# Patient Record
Sex: Female | Born: 1965
Health system: Southern US, Community
[De-identification: ages and names within clinical notes are randomized; demographics above are authoritative.]

## PROBLEM LIST (undated history)

## (undated) DIAGNOSIS — T7840XA Allergy, unspecified, initial encounter: Secondary | ICD-10-CM

## (undated) HISTORY — DX: Allergy, unspecified, initial encounter: T78.40XA

---

## 1997-08-04 ENCOUNTER — Other Ambulatory Visit: Admission: RE | Admit: 1997-08-04 | Discharge: 1997-08-04 | Payer: Self-pay | Admitting: Obstetrics and Gynecology

## 1999-07-25 ENCOUNTER — Other Ambulatory Visit: Admission: RE | Admit: 1999-07-25 | Discharge: 1999-07-25 | Payer: Self-pay | Admitting: *Deleted

## 2000-07-26 ENCOUNTER — Other Ambulatory Visit: Admission: RE | Admit: 2000-07-26 | Discharge: 2000-07-26 | Payer: Self-pay | Admitting: *Deleted

## 2001-08-05 ENCOUNTER — Other Ambulatory Visit: Admission: RE | Admit: 2001-08-05 | Discharge: 2001-08-05 | Payer: Self-pay | Admitting: Obstetrics and Gynecology

## 2002-08-07 ENCOUNTER — Other Ambulatory Visit: Admission: RE | Admit: 2002-08-07 | Discharge: 2002-08-07 | Payer: Self-pay | Admitting: Obstetrics and Gynecology

## 2003-03-30 ENCOUNTER — Ambulatory Visit (HOSPITAL_COMMUNITY): Admission: RE | Admit: 2003-03-30 | Discharge: 2003-03-30 | Payer: Self-pay | Admitting: Obstetrics and Gynecology

## 2003-03-30 ENCOUNTER — Encounter: Payer: Self-pay | Admitting: Family Medicine

## 2007-11-06 ENCOUNTER — Encounter: Admission: RE | Admit: 2007-11-06 | Discharge: 2007-11-06 | Payer: Self-pay | Admitting: Obstetrics and Gynecology

## 2007-11-06 ENCOUNTER — Encounter: Payer: Self-pay | Admitting: Family Medicine

## 2007-11-18 ENCOUNTER — Encounter (INDEPENDENT_AMBULATORY_CARE_PROVIDER_SITE_OTHER): Payer: Self-pay | Admitting: *Deleted

## 2007-11-28 ENCOUNTER — Ambulatory Visit: Payer: Self-pay | Admitting: Family Medicine

## 2007-11-28 DIAGNOSIS — E039 Hypothyroidism, unspecified: Secondary | ICD-10-CM | POA: Insufficient documentation

## 2007-11-29 LAB — CONVERTED CEMR LAB
T3, Free: 2.6 pg/mL (ref 2.3–4.2)
T4, Total: 10.4 ug/dL (ref 5.0–12.5)

## 2007-12-01 ENCOUNTER — Encounter (INDEPENDENT_AMBULATORY_CARE_PROVIDER_SITE_OTHER): Payer: Self-pay | Admitting: *Deleted

## 2008-11-12 ENCOUNTER — Encounter: Payer: Self-pay | Admitting: Family Medicine

## 2008-11-15 ENCOUNTER — Encounter: Admission: RE | Admit: 2008-11-15 | Discharge: 2008-11-15 | Payer: Self-pay | Admitting: Family Medicine

## 2008-11-23 ENCOUNTER — Ambulatory Visit: Payer: Self-pay | Admitting: Family Medicine

## 2008-11-23 DIAGNOSIS — IMO0001 Reserved for inherently not codable concepts without codable children: Secondary | ICD-10-CM

## 2008-11-25 ENCOUNTER — Telehealth (INDEPENDENT_AMBULATORY_CARE_PROVIDER_SITE_OTHER): Payer: Self-pay | Admitting: *Deleted

## 2008-11-25 LAB — CONVERTED CEMR LAB: Thyroperoxidase Ab SerPl-aCnc: 33.8 (ref 0.0–60.0)

## 2008-12-07 ENCOUNTER — Encounter (INDEPENDENT_AMBULATORY_CARE_PROVIDER_SITE_OTHER): Payer: Self-pay | Admitting: *Deleted

## 2008-12-07 LAB — CONVERTED CEMR LAB
ALT: 15 units/L (ref 0–35)
AST: 22 units/L (ref 0–37)
Basophils Relative: 0.4 % (ref 0.0–3.0)
Bilirubin, Direct: 0.1 mg/dL (ref 0.0–0.3)
Chloride: 102 meq/L (ref 96–112)
Creatinine, Ser: 0.9 mg/dL (ref 0.4–1.2)
Eosinophils Relative: 1.3 % (ref 0.0–5.0)
GFR calc non Af Amer: 73.3 mL/min (ref >60)
HCT: 37.6 % (ref 36.0–46.0)
LDL Cholesterol: 81 mg/dL (ref 0–99)
MCV: 91.9 fL (ref 78.0–100.0)
Monocytes Absolute: 0.6 10*3/uL (ref 0.1–1.0)
Monocytes Relative: 8.1 % (ref 3.0–12.0)
Neutrophils Relative %: 61 % (ref 43.0–77.0)
Potassium: 4.1 meq/L (ref 3.5–5.1)
RBC: 4.08 M/uL (ref 3.87–5.11)
T3, Free: 3.1 pg/mL (ref 2.3–4.2)
Total Bilirubin: 0.9 mg/dL (ref 0.3–1.2)
Total CHOL/HDL Ratio: 3
Total Protein: 6.8 g/dL (ref 6.0–8.3)
VLDL: 16.8 mg/dL (ref 0.0–40.0)
WBC: 7.5 10*3/uL (ref 4.5–10.5)

## 2009-11-14 ENCOUNTER — Ambulatory Visit: Payer: Self-pay | Admitting: Family Medicine

## 2009-11-14 DIAGNOSIS — J019 Acute sinusitis, unspecified: Secondary | ICD-10-CM

## 2009-11-28 ENCOUNTER — Encounter: Admission: RE | Admit: 2009-11-28 | Discharge: 2009-11-28 | Payer: Self-pay | Admitting: Family Medicine

## 2010-02-12 ENCOUNTER — Encounter: Payer: Self-pay | Admitting: Obstetrics and Gynecology

## 2010-02-21 NOTE — Assessment & Plan Note (Signed)
Summary: congested/cough/cbs   Vital Signs:  Patient profile:   45 year old female Weight:      139 pounds BMI:     20.60 Temp:     98.6 degrees F oral BP sitting:   114 / 76  (left arm)  Vitals Entered By: Doristine Devoid CMA (November 14, 2009 4:26 PM) CC: sinus congestion and drainage x2 wks    History of Present Illness: 45 yo woman here today for sinus congestion.  2.5 weeks ago had URI- nasal congestion, PND.  started Zyrtec D.  continues w/ heavy drainage.  unable to sleep due to gagging feeling.  constantly clearing throat.  + cough due to drainage.  no fevers.  + facial pain/pressure.  no ear pain.  + sick contacts.  also using Mucinex.  Current Medications (verified): 1)  Femcon Fe 0.4-35 Mg-Mcg Chew (Norethin-Eth Estradiol-Fe) .... As Directed 2)  Multivitamins  Caps (Multiple Vitamin) .... Daily 3)  Caltrate 600+d Plus 600-400 Mg-Unit Tabs (Calcium Carbonate-Vit D-Min) .... Daily. 4)  Vitamin D3 2000 Unit Caps (Cholecalciferol) .Marland Kitchen.. 1 By Mouth Daily.  Allergies (verified): No Known Drug Allergies  Review of Systems      See HPI  Physical Exam  General:  Well-developed,well-nourished,in no acute distress; alert,appropriate and cooperative throughout examination.  constantly clearing throat Head:  NCAT, + TTP over frontal and maxillary sinuses Eyes:  no injxn or inflammation Ears:  External ear exam shows no significant lesions or deformities.  Otoscopic examination reveals clear canals, tympanic membranes are intact bilaterally without bulging, retraction, inflammation or discharge. Hearing is grossly normal bilaterally. Nose:  + turbinate edema and congestion Mouth:  + PND Neck:  No deformities, masses, or tenderness noted. Lungs:  Normal respiratory effort, chest expands symmetrically. Lungs are clear to auscultation, no crackles or wheezes. Heart:  Normal rate and regular rhythm. S1 and S2 normal without gallop, murmur, click, rub or other extra  sounds.   Impression & Recommendations:  Problem # 1:  SINUSITIS - ACUTE-NOS (ICD-461.9) Assessment New pt w/ sxs consistent w/ sinus infxn.  start amox.  add nasonex for nasal congestion and PND.  reviewed supportive care and red flags that should prompt return.  Pt expresses understanding and is in agreement w/ this plan. Her updated medication list for this problem includes:    Amoxicillin 500 Mg Tabs (Amoxicillin) .Marland Kitchen... 2 tabs by mouth two times a day x10 days.  take w/ food.  Complete Medication List: 1)  Femcon Fe 0.4-35 Mg-mcg Chew (Norethin-eth estradiol-fe) .... As directed 2)  Multivitamins Caps (Multiple vitamin) .... Daily 3)  Caltrate 600+d Plus 600-400 Mg-unit Tabs (Calcium carbonate-vit d-min) .... Daily. 4)  Vitamin D3 2000 Unit Caps (Cholecalciferol) .Marland Kitchen.. 1 by mouth daily. 5)  Amoxicillin 500 Mg Tabs (Amoxicillin) .... 2 tabs by mouth two times a day x10 days.  take w/ food. 6)  Diflucan 150 Mg Tabs (Fluconazole) .... Once daily.  may repeat in 3 days if sxs persist  Patient Instructions: 1)  Follow up as needed 2)  Use the Nasonex-2 sprays each nostril- daily 3)  Start the Amox for a sinus infection 4)  Continue the mucinex 5)  Drink plenty of fluids 6)  Continue the Zyrtec 7)  Call with any questions or concerns 8)  Hang in there!! Prescriptions: DIFLUCAN 150 MG TABS (FLUCONAZOLE) once daily.  may repeat in 3 days if sxs persist  #2 x 0   Entered and Authorized by:   Neena Rhymes MD   Signed  by:   Neena Rhymes MD on 11/14/2009   Method used:   Electronically to        [facility] [phone]retail[facility]       [address]       [place], [place]  [address]       Ph: [phone]       Fax: [phone]   RxID:   [id number] AMOXICILLIN 500 MG TABS (AMOXICILLIN) 2 tabs by mouth two times a day x10 days.  take w/ food.  #40 x 0   Entered and Authorized by:   Neena Rhymes MD   Signed by:   Neena Rhymes MD on  11/14/2009   Method used:   [facility] to        [facility] [phone][facility] (retail)       [address]       [place], [place]  [address]       Ph: [phone]       Fax: [phone]   RxID:   [id number]    Orders Added: 1)  Est. Patient Level III [30160]

## 2010-07-31 ENCOUNTER — Ambulatory Visit (INDEPENDENT_AMBULATORY_CARE_PROVIDER_SITE_OTHER): Payer: Managed Care, Other (non HMO) | Admitting: Family

## 2010-07-31 ENCOUNTER — Encounter: Payer: Self-pay | Admitting: Family

## 2010-07-31 ENCOUNTER — Telehealth: Payer: Self-pay | Admitting: Family

## 2010-07-31 ENCOUNTER — Ambulatory Visit (HOSPITAL_BASED_OUTPATIENT_CLINIC_OR_DEPARTMENT_OTHER)
Admission: RE | Admit: 2010-07-31 | Discharge: 2010-07-31 | Disposition: A | Discharge status: Home / Self Care | Payer: Worker's Compensation | Source: Ambulatory Visit | Attending: Family | Admitting: Family

## 2010-07-31 VITALS — BP 98/60 | HR 72 | Temp 97.8°F | Resp 16 | Wt 143.1 lb

## 2010-07-31 DIAGNOSIS — X500XXA Overexertion from strenuous movement or load, initial encounter: Secondary | ICD-10-CM

## 2010-07-31 DIAGNOSIS — S92909A Unspecified fracture of unspecified foot, initial encounter for closed fracture: Secondary | ICD-10-CM

## 2010-07-31 DIAGNOSIS — S92901A Unspecified fracture of right foot, initial encounter for closed fracture: Secondary | ICD-10-CM

## 2010-07-31 DIAGNOSIS — Z0189 Encounter for other specified special examinations: Secondary | ICD-10-CM

## 2010-07-31 DIAGNOSIS — M79671 Pain in right foot: Secondary | ICD-10-CM

## 2010-07-31 DIAGNOSIS — X58XXXA Exposure to other specified factors, initial encounter: Secondary | ICD-10-CM | POA: Insufficient documentation

## 2010-07-31 DIAGNOSIS — S92309A Fracture of unspecified metatarsal bone(s), unspecified foot, initial encounter for closed fracture: Secondary | ICD-10-CM | POA: Insufficient documentation

## 2010-07-31 DIAGNOSIS — M79609 Pain in unspecified limb: Secondary | ICD-10-CM | POA: Insufficient documentation

## 2010-07-31 MED ORDER — FLUCONAZOLE 150 MG PO TABS: ORAL_TABLET | ORAL | Status: DC

## 2010-07-31 NOTE — Telephone Encounter (Signed)
Reviewed xray right foot- Acute or subacute oblique right distal fifth metatarsal shaft fracture.  Please contact patient and let her know that her x-ray does show a fracture of the bone leading to her small toe.  I will refer her to orthopedics for further recommendations this week.  She should continue ice and motrin PRN.  Try to stay off of her foot as much as possible until evaluated by orthopedics.

## 2010-07-31 NOTE — Patient Instructions (Signed)
Please complete your x-ray on the first floor.  Call if you yeast infection worsens or if it does not resolve with the diflucan.

## 2010-07-31 NOTE — Progress Notes (Signed)
  Subjective:    Patient ID: Holly Bailey, female    DOB: 02-Jun-1965, 45 y.o.   MRN: [id number]  HPI  [name] is a 45 yr old female who twisted her right foot while pivoting 10-11 days ago.  It was black and blue, swollen.  She was on vacation.  Now notes that her swelling has improved.  Tried Motrin OTC BID and ice which helped with the pain.  Sw was on vacation and was unable to stay off of her foot. Still hurting, can weight bear.  Pain is localized on the right dorsal/lateral foot.     Review of Systems See HPI  No past medical history on file.  History   Social History  . Marital Status: Married    Spouse Name: N/A    Number of Children: N/A  . Years of Education: N/A   Occupational History  . Not on file.   Social History Main Topics  . Smoking status: Never Smoker   . Smokeless tobacco: Never Used  . Alcohol Use: Not on file  . Drug Use: Not on file  . Sexually Active: Not on file   Other Topics Concern  . Not on file   Social History Narrative  . No narrative on file    No past surgical history on file.  No family history on file.  No Known Allergies  No current outpatient prescriptions on file prior to visit.    BP 98/60  Pulse 72  Temp(Src) 97.8 F (36.6 C) (Oral)  Resp 16  Wt 143 lb 1.3 oz (64.901 kg)       Objective:   Physical Exam  Constitutional: She appears well-developed and well-nourished.  HENT:  Head: Normocephalic and atraumatic.  Cardiovascular: Normal rate and regular rhythm.   Pulmonary/Chest: Effort normal and breath sounds normal.  Musculoskeletal:       Positive swelling noted of the right foot. Some tenderness is noted to palpation at the base of the fifth metatarsal. Patient is noted to have bruising on the plantar surface of her foot. She has mild associated swelling.          Assessment & Plan:

## 2010-08-01 DIAGNOSIS — Z309 Encounter for contraceptive management, unspecified: Secondary | ICD-10-CM

## 2010-08-01 NOTE — Telephone Encounter (Signed)
Left message on machine to return my call. 

## 2010-08-01 NOTE — Telephone Encounter (Signed)
Spoke to patient reviewed xray findings re: fracture.  She will see ortho tomorrow AM.

## 2010-08-08 DIAGNOSIS — S92901A Unspecified fracture of right foot, initial encounter for closed fracture: Secondary | ICD-10-CM | POA: Insufficient documentation

## 2010-08-08 NOTE — Assessment & Plan Note (Signed)
X-ray shows oblique right distal fifth metatarsal shaft fracture. The patient was referred to orthopedics for further evaluation and treatment. See phone note.

## 2010-10-27 ENCOUNTER — Other Ambulatory Visit: Payer: Self-pay | Admitting: Family Medicine

## 2010-10-27 DIAGNOSIS — Z1231 Encounter for screening mammogram for malignant neoplasm of breast: Secondary | ICD-10-CM

## 2010-11-14 ENCOUNTER — Other Ambulatory Visit: Payer: Self-pay | Admitting: Sports Medicine

## 2010-11-14 ENCOUNTER — Ambulatory Visit
Admission: RE | Admit: 2010-11-14 | Discharge: 2010-11-14 | Disposition: A | Discharge status: Home / Self Care | Payer: Worker's Compensation | Source: Ambulatory Visit | Attending: Sports Medicine | Admitting: Sports Medicine

## 2010-11-14 DIAGNOSIS — T148XXA Other injury of unspecified body region, initial encounter: Secondary | ICD-10-CM

## 2011-01-12 ENCOUNTER — Ambulatory Visit: Payer: Self-pay

## 2011-02-07 ENCOUNTER — Ambulatory Visit: Payer: Worker's Compensation

## 2011-02-19 ENCOUNTER — Ambulatory Visit
Admission: RE | Admit: 2011-02-19 | Discharge: 2011-02-19 | Disposition: A | Discharge status: Home / Self Care | Payer: Worker's Compensation | Source: Ambulatory Visit | Attending: Family Medicine | Admitting: Family Medicine

## 2011-02-19 DIAGNOSIS — Z1231 Encounter for screening mammogram for malignant neoplasm of breast: Secondary | ICD-10-CM

## 2011-02-22 ENCOUNTER — Other Ambulatory Visit: Payer: Self-pay | Admitting: Family Medicine

## 2011-02-22 DIAGNOSIS — R928 Other abnormal and inconclusive findings on diagnostic imaging of breast: Secondary | ICD-10-CM

## 2011-03-01 ENCOUNTER — Ambulatory Visit
Admission: RE | Admit: 2011-03-01 | Discharge: 2011-03-01 | Disposition: A | Discharge status: Home / Self Care | Payer: BC Managed Care – PPO | Source: Ambulatory Visit | Attending: Family Medicine | Admitting: Family Medicine

## 2011-03-01 DIAGNOSIS — R928 Other abnormal and inconclusive findings on diagnostic imaging of breast: Secondary | ICD-10-CM

## 2012-01-24 ENCOUNTER — Other Ambulatory Visit: Payer: Self-pay | Admitting: Family Medicine

## 2012-01-24 DIAGNOSIS — Z1231 Encounter for screening mammogram for malignant neoplasm of breast: Secondary | ICD-10-CM

## 2012-02-20 ENCOUNTER — Ambulatory Visit: Payer: BC Managed Care – PPO

## 2012-02-20 ENCOUNTER — Ambulatory Visit
Admission: RE | Admit: 2012-02-20 | Discharge: 2012-02-20 | Disposition: A | Discharge status: Home / Self Care | Payer: BC Managed Care – PPO | Source: Ambulatory Visit | Attending: Family Medicine | Admitting: Family Medicine

## 2012-02-20 DIAGNOSIS — Z1231 Encounter for screening mammogram for malignant neoplasm of breast: Secondary | ICD-10-CM

## 2012-04-16 ENCOUNTER — Ambulatory Visit (INDEPENDENT_AMBULATORY_CARE_PROVIDER_SITE_OTHER): Payer: BC Managed Care – PPO | Admitting: Family Medicine

## 2012-04-16 VITALS — BP 92/62 | HR 76 | Temp 98.9°F | Wt 153.0 lb

## 2012-04-16 DIAGNOSIS — R21 Rash and other nonspecific skin eruption: Secondary | ICD-10-CM

## 2012-04-16 MED ORDER — PREDNISONE 10 MG PO TABS: ORAL_TABLET | ORAL | Status: DC

## 2012-04-16 MED ORDER — METHYLPREDNISOLONE ACETATE 80 MG/ML IJ SUSP
80.0000 mg | Freq: Once | INTRAMUSCULAR | Status: AC
  Administered 2012-04-16: 80 mg via INTRAMUSCULAR

## 2012-04-16 NOTE — Patient Instructions (Signed)

## 2012-04-17 ENCOUNTER — Encounter: Payer: Self-pay | Admitting: Family Medicine

## 2012-04-17 NOTE — Progress Notes (Signed)
  Subjective:     Holly Bailey is a 47 y.o. female who presents for evaluation of a rash involving the trunk. Rash started 3 days ago. Lesions are pink, and raised in texture. Rash has changed over time. Rash is pruritic. Associated symptoms: none. Patient denies: abdominal pain, arthralgia, congestion, cough, crankiness, decrease in appetite, decrease in energy level, fever, headache, irritability, myalgia, nausea, sore throat and vomiting. Patient has not had contacts with similar rash. Patient has had new exposures (soaps, lotions, laundry detergents, foods, medications, plants, insects or animals).---tuna  The following portions of the patient's history were reviewed and updated as appropriate: allergies, current medications, past family history, past medical history, past social history, past surgical history and problem list.  Review of Systems Pertinent items are noted in HPI.    Objective:    BP 92/62  Pulse 76  Temp(Src) 98.9 F (37.2 C) (Oral)  Wt 153 lb (69.4 kg)  BMI 22.58 kg/m2  SpO2 98% General:  alert, cooperative, appears stated age and no distress  Skin:  papules noted on extremities and trunk     Assessment:    rash-- allergic reaction to tuna    Plan:    Medications: benadryl and steroids: pred taper. verbal and written patient instruction given. Follow up in several days. --if no better

## 2012-11-28 ENCOUNTER — Other Ambulatory Visit: Payer: Self-pay

## 2012-11-28 DIAGNOSIS — Z1231 Encounter for screening mammogram for malignant neoplasm of breast: Secondary | ICD-10-CM

## 2013-01-28 ENCOUNTER — Telehealth: Payer: Self-pay | Admitting: *Deleted

## 2013-01-28 ENCOUNTER — Ambulatory Visit (INDEPENDENT_AMBULATORY_CARE_PROVIDER_SITE_OTHER): Payer: BC Managed Care – PPO | Admitting: Family Medicine

## 2013-01-28 ENCOUNTER — Encounter: Payer: Self-pay | Admitting: Family Medicine

## 2013-01-28 VITALS — BP 100/68 | HR 80 | Temp 98.3°F | Wt 156.0 lb

## 2013-01-28 DIAGNOSIS — R350 Frequency of micturition: Secondary | ICD-10-CM

## 2013-01-28 LAB — POCT URINALYSIS DIPSTICK
Bilirubin, UA: NEGATIVE
Glucose, UA: NEGATIVE
Ketones, UA: NEGATIVE
NITRITE UA: NEGATIVE
PH UA: 6
Spec Grav, UA: 1.015
UROBILINOGEN UA: 0.2

## 2013-01-28 MED ORDER — FLUCONAZOLE 150 MG PO TABS: 150.0000 mg | ORAL_TABLET | Freq: Once | ORAL | Status: DC

## 2013-01-28 MED ORDER — LEVOFLOXACIN 250 MG PO TABS: 250.0000 mg | ORAL_TABLET | Freq: Every day | ORAL | Status: DC

## 2013-01-28 NOTE — Progress Notes (Signed)
  Subjective:    Holly Bailey is a 48 y.o. female who complains of burning with urination and frequency. She has had symptoms for 3 days..  No other complaints.  Patient denies back pain, congestion, cough, fever, headache, rhinitis, sorethroat, stomach ache and vaginal discharge. Patient does have a history of recurrent UTI. Patient does not have a history of pyelonephritis.   The following portions of the patient's history were reviewed and updated as appropriate: allergies, current medications, past family history, past medical history, past social history, past surgical history and problem list.  Review of Systems Pertinent items are noted in HPI.    Objective:    BP 100/68  Pulse 80  Temp(Src) 98.3 F (36.8 C) (Oral)  Wt 156 lb (70.761 kg)  SpO2 99% General appearance: alert, cooperative, appears stated age and no distress  Laboratory:  Urine dipstick: mod for hemoglobin and 3+ for leukocyte esterase.   Micro exam: not done.    Assessment:    Acute cystitis and UTI     Plan:  Check culture  Medications: levaquin per pt request--- "its the only thing that work". Maintain adequate hydration. Follow up if symptoms not improving, and as needed.

## 2013-01-28 NOTE — Progress Notes (Signed)
Pre visit review using our clinic review tool, if applicable. No additional management support is needed unless otherwise documented below in the visit note. 

## 2013-01-28 NOTE — Telephone Encounter (Signed)
Rx sent      KP 

## 2013-01-28 NOTE — Telephone Encounter (Signed)
Patient called and stated she for got to ask dr Laury Axonlowne to send in Diflucan for her too as well because of the levofloxacin (LEVAQUIN) 250 MG tablet cause her to have a yeast infection.   [facility] [place]

## 2013-01-28 NOTE — Patient Instructions (Signed)
Urinary Tract Infection  Urinary tract infections (UTIs) can develop anywhere along your urinary tract. Your urinary tract is your body's drainage system for removing wastes and extra water. Your urinary tract includes two kidneys, two ureters, a bladder, and a urethra. Your kidneys are a pair of bean-shaped organs. Each kidney is about the size of your fist. They are located below your ribs, one on each side of your spine.  CAUSES  Infections are caused by microbes, which are microscopic organisms, including fungi, viruses, and bacteria. These organisms are so small that they can only be seen through a microscope. Bacteria are the microbes that most commonly cause UTIs.  SYMPTOMS   Symptoms of UTIs may vary by age and gender of the patient and by the location of the infection. Symptoms in young women typically include a frequent and intense urge to urinate and a painful, burning feeling in the bladder or urethra during urination. Older women and men are more likely to be tired, shaky, and weak and have muscle aches and abdominal pain. A fever may mean the infection is in your kidneys. Other symptoms of a kidney infection include pain in your back or sides below the ribs, nausea, and vomiting.  DIAGNOSIS  To diagnose a UTI, your caregiver will ask you about your symptoms. Your caregiver also will ask to provide a urine sample. The urine sample will be tested for bacteria and white blood cells. White blood cells are made by your body to help fight infection.  TREATMENT   Typically, UTIs can be treated with medication. Because most UTIs are caused by a bacterial infection, they usually can be treated with the use of antibiotics. The choice of antibiotic and length of treatment depend on your symptoms and the type of bacteria causing your infection.  HOME CARE INSTRUCTIONS   If you were prescribed antibiotics, take them exactly as your caregiver instructs you. Finish the medication even if you feel better after you  have only taken some of the medication.   Drink enough water and fluids to keep your urine clear or pale yellow.   Avoid caffeine, tea, and carbonated beverages. They tend to irritate your bladder.   Empty your bladder often. Avoid holding urine for long periods of time.   Empty your bladder before and after sexual intercourse.   After a bowel movement, women should cleanse from front to back. Use each tissue only once.  SEEK MEDICAL CARE IF:    You have back pain.   You develop a fever.   Your symptoms do not begin to resolve within 3 days.  SEEK IMMEDIATE MEDICAL CARE IF:    You have severe back pain or lower abdominal pain.   You develop chills.   You have nausea or vomiting.   You have continued burning or discomfort with urination.  MAKE SURE YOU:    Understand these instructions.   Will watch your condition.   Will get help right away if you are not doing well or get worse.  Document Released: 10/18/2004 Document Revised: 07/10/2011 Document Reviewed: 02/16/2011  [employer] Patient Information 2014 [employer].

## 2013-01-30 ENCOUNTER — Telehealth: Payer: Self-pay | Admitting: Family Medicine

## 2013-01-30 LAB — URINE CULTURE: Colony Count: >=100000

## 2013-01-30 MED ORDER — LEVOFLOXACIN 500 MG PO TABS: 500.0000 mg | ORAL_TABLET | Freq: Every day | ORAL | Status: DC

## 2013-01-30 NOTE — Telephone Encounter (Signed)
Discussed with patient. Rx. Sent.

## 2013-01-30 NOTE — Telephone Encounter (Signed)
levaquin 500 mg 1 po qd for 5 days

## 2013-01-30 NOTE — Telephone Encounter (Signed)
Patient is calling to request a higher dose of levofloxacin (LEVAQUIN) 250 MG rx. States that she is on day 3 of 5 and her symptoms have not improved. Please advise. [facility] [place]

## 2013-01-30 NOTE — Telephone Encounter (Signed)
Please advise      KP 

## 2013-03-06 ENCOUNTER — Ambulatory Visit
Admission: RE | Admit: 2013-03-06 | Discharge: 2013-03-06 | Disposition: A | Discharge status: Home / Self Care | Payer: BC Managed Care – PPO | Source: Ambulatory Visit

## 2013-03-06 ENCOUNTER — Ambulatory Visit: Payer: BC Managed Care – PPO

## 2013-03-06 ENCOUNTER — Other Ambulatory Visit: Payer: Self-pay

## 2013-03-06 DIAGNOSIS — Z1231 Encounter for screening mammogram for malignant neoplasm of breast: Secondary | ICD-10-CM

## 2013-04-11 ENCOUNTER — Encounter: Payer: Self-pay | Admitting: Family Medicine

## 2013-04-11 ENCOUNTER — Ambulatory Visit (INDEPENDENT_AMBULATORY_CARE_PROVIDER_SITE_OTHER): Payer: BC Managed Care – PPO | Admitting: Family Medicine

## 2013-04-11 VITALS — BP 98/72 | HR 84 | Temp 98.2°F | Wt 156.0 lb

## 2013-04-11 DIAGNOSIS — L739 Follicular disorder, unspecified: Secondary | ICD-10-CM

## 2013-04-11 DIAGNOSIS — L729 Follicular cyst of the skin and subcutaneous tissue, unspecified: Principal | ICD-10-CM

## 2013-04-11 DIAGNOSIS — L678 Other hair color and hair shaft abnormalities: Secondary | ICD-10-CM

## 2013-04-11 DIAGNOSIS — L738 Other specified follicular disorders: Secondary | ICD-10-CM

## 2013-04-11 MED ORDER — BACITRACIN 500 UNIT/GM EX OINT: 1.0000 "application " | TOPICAL_OINTMENT | Freq: Two times a day (BID) | CUTANEOUS | Status: DC

## 2013-04-11 NOTE — Patient Instructions (Signed)
Nice to meet you Ocean spray or nasal saline only.  Try the samples we gave you to see if they help  Bacitracin twice daily.  Come back in 2 week sif still not better.

## 2013-04-11 NOTE — Assessment & Plan Note (Signed)
Patient has what appears to be more of a folliculitis of the nose. Patient is concern for a MRSA infection in full sentences likely not related. Patient was given some samples of triple antibiotic toward the potential allergy reaction. Patient also given a prescription for bacitracin which I think will be beneficial. We discussed salines that could be helpful. Patient should avoid corticosteroid. This continues to give patient problems with the amount of closure I noted of her sinuses consider a referral to ENT in the future. She will discuss this with primary care provider. If this does not help patient may need oral antibiotics for the folliculitis and she knows to seek medical attention if this does not clear up in the next week

## 2013-04-11 NOTE — Progress Notes (Signed)
  Holly ScaleZach Severiano Bailey D.O. [facility] [address] [place], [place] [address] Phone: [phone] Subjective:     CC: bumps inside nose  OZH:[id number]:Subjective Holly DoppStacey L Bailey is a 48 y.o. female coming in with complaint of bumps inside her nose. Patient has had this periodically multiple times. Patient has actually usually be able to put pressure on these bumps and they seem to pop causing some pus within it seems to resolve on its own. Patient states his last time has been lasting for approximately 2 weeks. Patient went to an urgent care recently who told her to continue with irrigation of the nose did not give her any type of medications. Patient actually was given Flonase by another provider and has tried doing this and seems unfortunately make it worse. Patient denies any fevers or chills or any bleeding. Patient has had chronic difficulties with her sinuses for multiple years.     Past medical history, social, surgical and family history all reviewed in electronic medical record.   Review of Systems: No headache, visual changes, nausea, vomiting, diarrhea, constipation, dizziness, abdominal pain, skin rash, fevers, chills, night sweats, weight loss, swollen lymph nodes, body aches, joint swelling, muscle aches, chest pain, shortness of breath, mood changes.   Objective Blood pressure 98/72, pulse 84, temperature 98.2 F (36.8 C), temperature source Oral, weight 156 lb (70.761 kg), SpO2 98.00%.  General: No apparent distress alert and oriented x3 mood and affect normal, dressed appropriately.  HEENT: Pupils equal, extraocular movements intact patient snares show that patient does have what appears to be more of this this folliculitis in the bilateral nares mucosa. Denies any severe inflammation but there is erythema of the mucosa. Patient does have a deviated septum to the right causing closure of the left tears proximally two thirds the way above the middle turbinate. Respiratory: Patient's  speak in full sentences and does not appear short of breath  Cardiovascular: No lower extremity edema, non tender, no erythema  Skin: Warm dry intact with no signs of infection or rash on extremities or on axial skeleton.  Abdomen: Soft nontender  Neuro: Cranial nerves II through XII are intact, neurovascularly intact in all extremities with 2+ DTRs and 2+ pulses.  Lymph: No lymphadenopathy of posterior or anterior cervical chain or axillae bilaterally.  Gait normal with good balance and coordination.  MSK:  Non tender with full range of motion and good stability and symmetric strength and tone of shoulders, elbows, wrist, hip, knee and ankles bilaterally.      Impression and Recommendations:     This case required medical decision making of moderate complexity.

## 2014-01-12 ENCOUNTER — Other Ambulatory Visit: Payer: Self-pay

## 2014-01-12 DIAGNOSIS — Z1231 Encounter for screening mammogram for malignant neoplasm of breast: Secondary | ICD-10-CM

## 2014-03-12 ENCOUNTER — Ambulatory Visit: Payer: BC Managed Care – PPO

## 2014-03-12 ENCOUNTER — Ambulatory Visit
Admission: RE | Admit: 2014-03-12 | Discharge: 2014-03-12 | Disposition: A | Discharge status: Home / Self Care | Payer: BLUE CROSS/BLUE SHIELD | Source: Ambulatory Visit

## 2014-03-12 DIAGNOSIS — Z1231 Encounter for screening mammogram for malignant neoplasm of breast: Secondary | ICD-10-CM

## 2015-03-15 ENCOUNTER — Other Ambulatory Visit: Payer: Self-pay

## 2015-03-15 DIAGNOSIS — Z1231 Encounter for screening mammogram for malignant neoplasm of breast: Secondary | ICD-10-CM

## 2015-04-01 ENCOUNTER — Ambulatory Visit
Admission: RE | Admit: 2015-04-01 | Discharge: 2015-04-01 | Disposition: A | Discharge status: Home / Self Care | Payer: BLUE CROSS/BLUE SHIELD | Source: Ambulatory Visit

## 2015-04-01 DIAGNOSIS — Z1231 Encounter for screening mammogram for malignant neoplasm of breast: Secondary | ICD-10-CM

## 2015-05-02 ENCOUNTER — Telehealth: Payer: Self-pay | Admitting: *Deleted

## 2015-05-02 NOTE — Telephone Encounter (Signed)
Unable to reach patient at time of pre-visit call. Left message for patient to return call when available.  

## 2015-05-03 ENCOUNTER — Encounter: Payer: Self-pay | Admitting: Family Medicine

## 2015-05-03 ENCOUNTER — Ambulatory Visit (INDEPENDENT_AMBULATORY_CARE_PROVIDER_SITE_OTHER): Payer: BLUE CROSS/BLUE SHIELD | Admitting: Family Medicine

## 2015-05-03 VITALS — BP 110/64 | HR 86 | Temp 98.2°F | Ht 68.5 in | Wt 159.0 lb

## 2015-05-03 DIAGNOSIS — N912 Amenorrhea, unspecified: Secondary | ICD-10-CM | POA: Diagnosis not present

## 2015-05-03 DIAGNOSIS — R5382 Chronic fatigue, unspecified: Secondary | ICD-10-CM

## 2015-05-03 DIAGNOSIS — Z23 Encounter for immunization: Secondary | ICD-10-CM | POA: Diagnosis not present

## 2015-05-03 DIAGNOSIS — Z0000 Encounter for general adult medical examination without abnormal findings: Secondary | ICD-10-CM

## 2015-05-03 LAB — COMPREHENSIVE METABOLIC PANEL
ALBUMIN: 4.1 g/dL (ref 3.5–5.2)
ALK PHOS: 44 U/L (ref 39–117)
ALT: 13 U/L (ref 0–35)
AST: 18 U/L (ref 0–37)
BILIRUBIN TOTAL: 0.8 mg/dL (ref 0.2–1.2)
BUN: 12 mg/dL (ref 6–23)
CO2: 26 mEq/L (ref 19–32)
CREATININE: 0.86 mg/dL (ref 0.40–1.20)
Calcium: 9.1 mg/dL (ref 8.4–10.5)
Chloride: 104 mEq/L (ref 96–112)
GFR: 75 mL/min (ref >60.00)
GLUCOSE: 88 mg/dL (ref 70–99)
POTASSIUM: 3.7 meq/L (ref 3.5–5.1)
SODIUM: 138 meq/L (ref 135–145)
Total Protein: 7.2 g/dL (ref 6.0–8.3)

## 2015-05-03 LAB — CBC
HCT: 39.9 % (ref 36.0–46.0)
HEMOGLOBIN: 13.6 g/dL (ref 12.0–15.0)
MCHC: 34.2 g/dL (ref 30.0–36.0)
MCV: 86.4 fl (ref 78.0–100.0)
Platelets: 299 10*3/uL (ref 150.0–400.0)
RBC: 4.62 Mil/uL (ref 3.87–5.11)
RDW: 12.9 % (ref 11.5–15.5)
WBC: 6.8 10*3/uL (ref 4.0–10.5)

## 2015-05-03 LAB — LUTEINIZING HORMONE: LH: 0.4 m[IU]/mL

## 2015-05-03 LAB — LIPID PANEL
Cholesterol: 169 mg/dL (ref 0–200)
HDL: 58.5 mg/dL (ref >39.00)
LDL Cholesterol: 98 mg/dL (ref 0–99)
NONHDL: 110.76
Total CHOL/HDL Ratio: 3
Triglycerides: 66 mg/dL (ref 0.0–149.0)
VLDL: 13.2 mg/dL (ref 0.0–40.0)

## 2015-05-03 LAB — TSH: TSH: 2.41 u[IU]/mL (ref 0.35–4.50)

## 2015-05-03 LAB — T4, FREE: Free T4: 0.89 ng/dL (ref 0.60–1.60)

## 2015-05-03 LAB — FOLLICLE STIMULATING HORMONE: FSH: 2.1 m[IU]/mL

## 2015-05-03 LAB — T3: T3 TOTAL: 145 ng/dL (ref 76–181)

## 2015-05-03 NOTE — Patient Instructions (Signed)
Preventive Care for Adults, Female A healthy lifestyle and preventive care can promote health and wellness. Preventive health guidelines for women include the following key practices.  A routine yearly physical is a good way to check with your health care provider about your health and preventive screening. It is a chance to share any concerns and updates on your health and to receive a thorough exam.  Visit your dentist for a routine exam and preventive care every 6 months. Brush your teeth twice a day and floss once a day. Good oral hygiene prevents tooth decay and gum disease.  The frequency of eye exams is based on your age, health, family medical history, use of contact lenses, and other factors. Follow your health care provider's recommendations for frequency of eye exams.  Eat a healthy diet. Foods like vegetables, fruits, whole grains, low-fat dairy products, and lean protein foods contain the nutrients you need without too many calories. Decrease your intake of foods high in solid fats, added sugars, and salt. Eat the right amount of calories for you.Get information about a proper diet from your health care provider, if necessary.  Regular physical exercise is one of the most important things you can do for your health. Most adults should get at least 150 minutes of moderate-intensity exercise (any activity that increases your heart rate and causes you to sweat) each week. In addition, most adults need muscle-strengthening exercises on 2 or more days a week.  Maintain a healthy weight. The body mass index (BMI) is a screening tool to identify possible weight problems. It provides an estimate of body fat based on height and weight. Your health care provider can find your BMI and can help you achieve or maintain a healthy weight.For adults 20 years and older:  A BMI below 18.5 is considered underweight.  A BMI of 18.5 to 24.9 is normal.  A BMI of 25 to 29.9 is considered overweight.  A  BMI of 30 and above is considered obese.  Maintain normal blood lipids and cholesterol levels by exercising and minimizing your intake of saturated fat. Eat a balanced diet with plenty of fruit and vegetables. Blood tests for lipids and cholesterol should begin at age 45 and be repeated every 5 years. If your lipid or cholesterol levels are high, you are over 50, or you are at high risk for heart disease, you may need your cholesterol levels checked more frequently.Ongoing high lipid and cholesterol levels should be treated with medicines if diet and exercise are not working.  If you smoke, find out from your health care provider how to quit. If you do not use tobacco, do not start.  Lung cancer screening is recommended for adults aged 45-80 years who are at high risk for developing lung cancer because of a history of smoking. A yearly low-dose CT scan of the lungs is recommended for people who have at least a 30-pack-year history of smoking and are a current smoker or have quit within the past 15 years. A pack year of smoking is smoking an average of 1 pack of cigarettes a day for 1 year (for example: 1 pack a day for 30 years or 2 packs a day for 15 years). Yearly screening should continue until the smoker has stopped smoking for at least 15 years. Yearly screening should be stopped for people who develop a health problem that would prevent them from having lung cancer treatment.  If you are pregnant, do not drink alcohol. If you are  breastfeeding, be very cautious about drinking alcohol. If you are not pregnant and choose to drink alcohol, do not have more than 1 drink per day. One drink is considered to be 12 ounces (355 mL) of beer, 5 ounces (148 mL) of wine, or 1.5 ounces (44 mL) of liquor.  Avoid use of street drugs. Do not share needles with anyone. Ask for help if you need support or instructions about stopping the use of drugs.  High blood pressure causes heart disease and increases the risk  of stroke. Your blood pressure should be checked at least every 1 to 2 years. Ongoing high blood pressure should be treated with medicines if weight loss and exercise do not work.  If you are 55-79 years old, ask your health care provider if you should take aspirin to prevent strokes.  Diabetes screening is done by taking a blood sample to check your blood glucose level after you have not eaten for a certain period of time (fasting). If you are not overweight and you do not have risk factors for diabetes, you should be screened once every 3 years starting at age 45. If you are overweight or obese and you are 40-70 years of age, you should be screened for diabetes every year as part of your cardiovascular risk assessment.  Breast cancer screening is essential preventive care for women. You should practice "breast self-awareness." This means understanding the normal appearance and feel of your breasts and may include breast self-examination. Any changes detected, no matter how small, should be reported to a health care provider. Women in their 20s and 30s should have a clinical breast exam (CBE) by a health care provider as part of a regular health exam every 1 to 3 years. After age 40, women should have a CBE every year. Starting at age 40, women should consider having a mammogram (breast X-ray test) every year. Women who have a family history of breast cancer should talk to their health care provider about genetic screening. Women at a high risk of breast cancer should talk to their health care providers about having an MRI and a mammogram every year.  Breast cancer gene (BRCA)-related cancer risk assessment is recommended for women who have family members with BRCA-related cancers. BRCA-related cancers include breast, ovarian, tubal, and peritoneal cancers. Having family members with these cancers may be associated with an increased risk for harmful changes (mutations) in the breast cancer genes BRCA1 and  BRCA2. Results of the assessment will determine the need for genetic counseling and BRCA1 and BRCA2 testing.  Your health care provider may recommend that you be screened regularly for cancer of the pelvic organs (ovaries, uterus, and vagina). This screening involves a pelvic examination, including checking for microscopic changes to the surface of your cervix (Pap test). You may be encouraged to have this screening done every 3 years, beginning at age 21.  For women ages 30-65, health care providers may recommend pelvic exams and Pap testing every 3 years, or they may recommend the Pap and pelvic exam, combined with testing for human papilloma virus (HPV), every 5 years. Some types of HPV increase your risk of cervical cancer. Testing for HPV may also be done on women of any age with unclear Pap test results.  Other health care providers may not recommend any screening for nonpregnant women who are considered low risk for pelvic cancer and who do not have symptoms. Ask your health care provider if a screening pelvic exam is right for   you.  If you have had past treatment for cervical cancer or a condition that could lead to cancer, you need Pap tests and screening for cancer for at least 20 years after your treatment. If Pap tests have been discontinued, your risk factors (such as having a new sexual partner) need to be reassessed to determine if screening should resume. Some women have medical problems that increase the chance of getting cervical cancer. In these cases, your health care provider may recommend more frequent screening and Pap tests.  Colorectal cancer can be detected and often prevented. Most routine colorectal cancer screening begins at the age of 50 years and continues through age 75 years. However, your health care provider may recommend screening at an earlier age if you have risk factors for colon cancer. On a yearly basis, your health care provider may provide home test kits to check  for hidden blood in the stool. Use of a small camera at the end of a tube, to directly examine the colon (sigmoidoscopy or colonoscopy), can detect the earliest forms of colorectal cancer. Talk to your health care provider about this at age 50, when routine screening begins. Direct exam of the colon should be repeated every 5-10 years through age 75 years, unless early forms of precancerous polyps or small growths are found.  People who are at an increased risk for hepatitis B should be screened for this virus. You are considered at high risk for hepatitis B if:  You were born in a country where hepatitis B occurs often. Talk with your health care provider about which countries are considered high risk.  Your parents were born in a high-risk country and you have not received a shot to protect against hepatitis B (hepatitis B vaccine).  You have HIV or AIDS.  You use needles to inject street drugs.  You live with, or have sex with, someone who has hepatitis B.  You get hemodialysis treatment.  You take certain medicines for conditions like cancer, organ transplantation, and autoimmune conditions.  Hepatitis C blood testing is recommended for all people born from 1945 through 1965 and any individual with known risks for hepatitis C.  Practice safe sex. Use condoms and avoid high-risk sexual practices to reduce the spread of sexually transmitted infections (STIs). STIs include gonorrhea, chlamydia, syphilis, trichomonas, herpes, HPV, and human immunodeficiency virus (HIV). Herpes, HIV, and HPV are viral illnesses that have no cure. They can result in disability, cancer, and death.  You should be screened for sexually transmitted illnesses (STIs) including gonorrhea and chlamydia if:  You are sexually active and are younger than 24 years.  You are older than 24 years and your health care provider tells you that you are at risk for this type of infection.  Your sexual activity has changed  since you were last screened and you are at an increased risk for chlamydia or gonorrhea. Ask your health care provider if you are at risk.  If you are at risk of being infected with HIV, it is recommended that you take a prescription medicine daily to prevent HIV infection. This is called preexposure prophylaxis (PrEP). You are considered at risk if:  You are sexually active and do not regularly use condoms or know the HIV status of your partner(s).  You take drugs by injection.  You are sexually active with a partner who has HIV.  Talk with your health care provider about whether you are at high risk of being infected with HIV. If   you choose to begin PrEP, you should first be tested for HIV. You should then be tested every 3 months for as long as you are taking PrEP.  Osteoporosis is a disease in which the bones lose minerals and strength with aging. This can result in serious bone fractures or breaks. The risk of osteoporosis can be identified using a bone density scan. Women ages 67 years and over and women at risk for fractures or osteoporosis should discuss screening with their health care providers. Ask your health care provider whether you should take a calcium supplement or vitamin D to reduce the rate of osteoporosis.  Menopause can be associated with physical symptoms and risks. Hormone replacement therapy is available to decrease symptoms and risks. You should talk to your health care provider about whether hormone replacement therapy is right for you.  Use sunscreen. Apply sunscreen liberally and repeatedly throughout the day. You should seek shade when your shadow is shorter than you. Protect yourself by wearing long sleeves, pants, a wide-brimmed hat, and sunglasses year round, whenever you are outdoors.  Once a month, do a whole body skin exam, using a mirror to look at the skin on your back. Tell your health care provider of new moles, moles that have irregular borders, moles that  are larger than a pencil eraser, or moles that have changed in shape or color.  Stay current with required vaccines (immunizations).  Influenza vaccine. All adults should be immunized every year.  Tetanus, diphtheria, and acellular pertussis (Td, Tdap) vaccine. Pregnant women should receive 1 dose of Tdap vaccine during each pregnancy. The dose should be obtained regardless of the length of time since the last dose. Immunization is preferred during the 27th-36th week of gestation. An adult who has not previously received Tdap or who does not know her vaccine status should receive 1 dose of Tdap. This initial dose should be followed by tetanus and diphtheria toxoids (Td) booster doses every 10 years. Adults with an unknown or incomplete history of completing a 3-dose immunization series with Td-containing vaccines should begin or complete a primary immunization series including a Tdap dose. Adults should receive a Td booster every 10 years.  Varicella vaccine. An adult without evidence of immunity to varicella should receive 2 doses or a second dose if she has previously received 1 dose. Pregnant females who do not have evidence of immunity should receive the first dose after pregnancy. This first dose should be obtained before leaving the health care facility. The second dose should be obtained 4-8 weeks after the first dose.  Human papillomavirus (HPV) vaccine. Females aged 13-26 years who have not received the vaccine previously should obtain the 3-dose series. The vaccine is not recommended for use in pregnant females. However, pregnancy testing is not needed before receiving a dose. If a female is found to be pregnant after receiving a dose, no treatment is needed. In that case, the remaining doses should be delayed until after the pregnancy. Immunization is recommended for any person with an immunocompromised condition through the age of 61 years if she did not get any or all doses earlier. During the  3-dose series, the second dose should be obtained 4-8 weeks after the first dose. The third dose should be obtained 24 weeks after the first dose and 16 weeks after the second dose.  Zoster vaccine. One dose is recommended for adults aged 30 years or older unless certain conditions are present.  Measles, mumps, and rubella (MMR) vaccine. Adults born  before 1957 generally are considered immune to measles and mumps. Adults born in 1957 or later should have 1 or more doses of MMR vaccine unless there is a contraindication to the vaccine or there is laboratory evidence of immunity to each of the three diseases. A routine second dose of MMR vaccine should be obtained at least 28 days after the first dose for students attending postsecondary schools, health care workers, or international travelers. People who received inactivated measles vaccine or an unknown type of measles vaccine during 1963-1967 should receive 2 doses of MMR vaccine. People who received inactivated mumps vaccine or an unknown type of mumps vaccine before 1979 and are at high risk for mumps infection should consider immunization with 2 doses of MMR vaccine. For females of childbearing age, rubella immunity should be determined. If there is no evidence of immunity, females who are not pregnant should be vaccinated. If there is no evidence of immunity, females who are pregnant should delay immunization until after pregnancy. Unvaccinated health care workers born before 1957 who lack laboratory evidence of measles, mumps, or rubella immunity or laboratory confirmation of disease should consider measles and mumps immunization with 2 doses of MMR vaccine or rubella immunization with 1 dose of MMR vaccine.  Pneumococcal 13-valent conjugate (PCV13) vaccine. When indicated, a person who is uncertain of his immunization history and has no record of immunization should receive the PCV13 vaccine. All adults 65 years of age and older should receive this  vaccine. An adult aged 19 years or older who has certain medical conditions and has not been previously immunized should receive 1 dose of PCV13 vaccine. This PCV13 should be followed with a dose of pneumococcal polysaccharide (PPSV23) vaccine. Adults who are at high risk for pneumococcal disease should obtain the PPSV23 vaccine at least 8 weeks after the dose of PCV13 vaccine. Adults older than 48 years of age who have normal immune system function should obtain the PPSV23 vaccine dose at least 1 year after the dose of PCV13 vaccine.  Pneumococcal polysaccharide (PPSV23) vaccine. When PCV13 is also indicated, PCV13 should be obtained first. All adults aged 65 years and older should be immunized. An adult younger than age 65 years who has certain medical conditions should be immunized. Any person who resides in a nursing home or long-term care facility should be immunized. An adult smoker should be immunized. People with an immunocompromised condition and certain other conditions should receive both PCV13 and PPSV23 vaccines. People with human immunodeficiency virus (HIV) infection should be immunized as soon as possible after diagnosis. Immunization during chemotherapy or radiation therapy should be avoided. Routine use of PPSV23 vaccine is not recommended for American Indians, [place] Natives, or people younger than 65 years unless there are medical conditions that require PPSV23 vaccine. When indicated, people who have unknown immunization and have no record of immunization should receive PPSV23 vaccine. One-time revaccination 5 years after the first dose of PPSV23 is recommended for people aged 19-64 years who have chronic kidney failure, nephrotic syndrome, asplenia, or immunocompromised conditions. People who received 1-2 doses of PPSV23 before age 65 years should receive another dose of PPSV23 vaccine at age 65 years or later if at least 5 years have passed since the previous dose. Doses of PPSV23 are not  needed for people immunized with PPSV23 at or after age 65 years.  Meningococcal vaccine. Adults with asplenia or persistent complement component deficiencies should receive 2 doses of quadrivalent meningococcal conjugate (MenACWY-D) vaccine. The doses should be obtained   at least 2 months apart. Microbiologists working with certain meningococcal bacteria, [place] recruits, people at risk during an outbreak, and people who travel to or live in countries with a high rate of meningitis should be immunized. A first-year college student up through age 34 years who is living in a residence hall should receive a dose if she did not receive a dose on or after her 16th birthday. Adults who have certain high-risk conditions should receive one or more doses of vaccine.  Hepatitis A vaccine. Adults who wish to be protected from this disease, have certain high-risk conditions, work with hepatitis A-infected animals, work in hepatitis A research labs, or travel to or work in countries with a high rate of hepatitis A should be immunized. Adults who were previously unvaccinated and who anticipate close contact with an international adoptee during the first 60 days after arrival in the [place] from a country with a high rate of hepatitis A should be immunized.  Hepatitis B vaccine. Adults who wish to be protected from this disease, have certain high-risk conditions, may be exposed to blood or other infectious body fluids, are household contacts or sex partners of hepatitis B positive people, are clients or workers in certain care facilities, or travel to or work in countries with a high rate of hepatitis B should be immunized.  Haemophilus influenzae type b (Hib) vaccine. A previously unvaccinated person with asplenia or sickle cell disease or having a scheduled splenectomy should receive 1 dose of Hib vaccine. Regardless of previous immunization, a recipient of a hematopoietic stem cell transplant should receive a  3-dose series 6-12 months after her successful transplant. Hib vaccine is not recommended for adults with HIV infection. Preventive Services / Frequency Ages 35 to 4 years  Blood pressure check.** / Every 3-5 years.  Lipid and cholesterol check.** / Every 5 years beginning at age 60.  Clinical breast exam.** / Every 3 years for women in their 71s and 10s.  BRCA-related cancer risk assessment.** / For women who have family members with a BRCA-related cancer (breast, ovarian, tubal, or peritoneal cancers).  Pap test.** / Every 2 years from ages 76 through 26. Every 3 years starting at age 61 through age 76 or 93 with a history of 3 consecutive normal Pap tests.  HPV screening.** / Every 3 years from ages 37 through ages 60 to 51 with a history of 3 consecutive normal Pap tests.  Hepatitis C blood test.** / For any individual with known risks for hepatitis C.  Skin self-exam. / Monthly.  Influenza vaccine. / Every year.  Tetanus, diphtheria, and acellular pertussis (Tdap, Td) vaccine.** / Consult your health care provider. Pregnant women should receive 1 dose of Tdap vaccine during each pregnancy. 1 dose of Td every 10 years.  Varicella vaccine.** / Consult your health care provider. Pregnant females who do not have evidence of immunity should receive the first dose after pregnancy.  HPV vaccine. / 3 doses over 6 months, if 93 and younger. The vaccine is not recommended for use in pregnant females. However, pregnancy testing is not needed before receiving a dose.  Measles, mumps, rubella (MMR) vaccine.** / You need at least 1 dose of MMR if you were born in 1957 or later. You may also need a 2nd dose. For females of childbearing age, rubella immunity should be determined. If there is no evidence of immunity, females who are not pregnant should be vaccinated. If there is no evidence of immunity, females who are  pregnant should delay immunization until after pregnancy.  Pneumococcal  13-valent conjugate (PCV13) vaccine.** / Consult your health care provider.  Pneumococcal polysaccharide (PPSV23) vaccine.** / 1 to 2 doses if you smoke cigarettes or if you have certain conditions.  Meningococcal vaccine.** / 1 dose if you are age 68 to 8 years and a Market researcher living in a residence hall, or have one of several medical conditions, you need to get vaccinated against meningococcal disease. You may also need additional booster doses.  Hepatitis A vaccine.** / Consult your health care provider.  Hepatitis B vaccine.** / Consult your health care provider.  Haemophilus influenzae type b (Hib) vaccine.** / Consult your health care provider. Ages 7 to 53 years  Blood pressure check.** / Every year.  Lipid and cholesterol check.** / Every 5 years beginning at age 25 years.  Lung cancer screening. / Every year if you are aged 11-80 years and have a 30-pack-year history of smoking and currently smoke or have quit within the past 15 years. Yearly screening is stopped once you have quit smoking for at least 15 years or develop a health problem that would prevent you from having lung cancer treatment.  Clinical breast exam.** / Every year after age 48 years.  BRCA-related cancer risk assessment.** / For women who have family members with a BRCA-related cancer (breast, ovarian, tubal, or peritoneal cancers).  Mammogram.** / Every year beginning at age 41 years and continuing for as long as you are in good health. Consult with your health care provider.  Pap test.** / Every 3 years starting at age 65 years through age 37 or 70 years with a history of 3 consecutive normal Pap tests.  HPV screening.** / Every 3 years from ages 72 years through ages 60 to 40 years with a history of 3 consecutive normal Pap tests.  Fecal occult blood test (FOBT) of stool. / Every year beginning at age 21 years and continuing until age 5 years. You may not need to do this test if you get  a colonoscopy every 10 years.  Flexible sigmoidoscopy or colonoscopy.** / Every 5 years for a flexible sigmoidoscopy or every 10 years for a colonoscopy beginning at age 35 years and continuing until age 48 years.  Hepatitis C blood test.** / For all people born from [birth year] through 1965 and any individual with known risks for hepatitis C.  Skin self-exam. / Monthly.  Influenza vaccine. / Every year.  Tetanus, diphtheria, and acellular pertussis (Tdap/Td) vaccine.** / Consult your health care provider. Pregnant women should receive 1 dose of Tdap vaccine during each pregnancy. 1 dose of Td every 10 years.  Varicella vaccine.** / Consult your health care provider. Pregnant females who do not have evidence of immunity should receive the first dose after pregnancy.  Zoster vaccine.** / 1 dose for adults aged 30 years or older.  Measles, mumps, rubella (MMR) vaccine.** / You need at least 1 dose of MMR if you were born in 1957 or later. You may also need a second dose. For females of childbearing age, rubella immunity should be determined. If there is no evidence of immunity, females who are not pregnant should be vaccinated. If there is no evidence of immunity, females who are pregnant should delay immunization until after pregnancy.  Pneumococcal 13-valent conjugate (PCV13) vaccine.** / Consult your health care provider.  Pneumococcal polysaccharide (PPSV23) vaccine.** / 1 to 2 doses if you smoke cigarettes or if you have certain conditions.  Meningococcal vaccine.** /  Consult your health care provider.  Hepatitis A vaccine.** / Consult your health care provider.  Hepatitis B vaccine.** / Consult your health care provider.  Haemophilus influenzae type b (Hib) vaccine.** / Consult your health care provider. Ages 64 years and over  Blood pressure check.** / Every year.  Lipid and cholesterol check.** / Every 5 years beginning at age 23 years.  Lung cancer screening. / Every year if you  are aged 16-80 years and have a 30-pack-year history of smoking and currently smoke or have quit within the past 15 years. Yearly screening is stopped once you have quit smoking for at least 15 years or develop a health problem that would prevent you from having lung cancer treatment.  Clinical breast exam.** / Every year after age 74 years.  BRCA-related cancer risk assessment.** / For women who have family members with a BRCA-related cancer (breast, ovarian, tubal, or peritoneal cancers).  Mammogram.** / Every year beginning at age 44 years and continuing for as long as you are in good health. Consult with your health care provider.  Pap test.** / Every 3 years starting at age 58 years through age 22 or 39 years with 3 consecutive normal Pap tests. Testing can be stopped between 65 and 70 years with 3 consecutive normal Pap tests and no abnormal Pap or HPV tests in the past 10 years.  HPV screening.** / Every 3 years from ages 64 years through ages 70 or 61 years with a history of 3 consecutive normal Pap tests. Testing can be stopped between 65 and 70 years with 3 consecutive normal Pap tests and no abnormal Pap or HPV tests in the past 10 years.  Fecal occult blood test (FOBT) of stool. / Every year beginning at age 40 years and continuing until age 27 years. You may not need to do this test if you get a colonoscopy every 10 years.  Flexible sigmoidoscopy or colonoscopy.** / Every 5 years for a flexible sigmoidoscopy or every 10 years for a colonoscopy beginning at age 7 years and continuing until age 32 years.  Hepatitis C blood test.** / For all people born from [birth year] through 1963 and any individual with known risks for hepatitis C.  Osteoporosis screening.** / A one-time screening for women ages 30 years and over and women at risk for fractures or osteoporosis.  Skin self-exam. / Monthly.  Influenza vaccine. / Every year.  Tetanus, diphtheria, and acellular pertussis (Tdap/Td)  vaccine.** / 1 dose of Td every 10 years.  Varicella vaccine.** / Consult your health care provider.  Zoster vaccine.** / 1 dose for adults aged 35 years or older.  Pneumococcal 13-valent conjugate (PCV13) vaccine.** / Consult your health care provider.  Pneumococcal polysaccharide (PPSV23) vaccine.** / 1 dose for all adults aged 46 years and older.  Meningococcal vaccine.** / Consult your health care provider.  Hepatitis A vaccine.** / Consult your health care provider.  Hepatitis B vaccine.** / Consult your health care provider.  Haemophilus influenzae type b (Hib) vaccine.** / Consult your health care provider. ** Family history and personal history of risk and conditions may change your health care provider's recommendations.   This information is not intended to replace advice given to you by your health care provider. Make sure you discuss any questions you have with your health care provider.   Document Released: 03/06/2001 Document Revised: 01/29/2014 Document Reviewed: 06/05/2010 Elsevier Interactive Patient Education [employer].

## 2015-05-03 NOTE — Progress Notes (Signed)
Subjective:    Patient ID: Holly Bailey, female    DOB: 09/22/1965, 50 y.o.   MRN: [id number]  Chief Complaint  Patient presents with  . Annual Exam    fasting    HPI Patient is in today for annual Physicial Exam.  Patient has some concerns with sleep, she falls asleep but does not stay asleep.  Patient also has some concern about thyroid and wants to get labs checked.  No past medical history on file.  No past surgical history on file.  No family history on file.  Social History   Social History  . Marital Status: Married    Spouse Name: N/A  . Number of Children: N/A  . Years of Education: N/A   Occupational History  . Not on file.   Social History Main Topics  . Smoking status: Never Smoker   . Smokeless tobacco: Never Used  . Alcohol Use: Not on file  . Drug Use: Not on file  . Sexual Activity: Not on file   Other Topics Concern  . Not on file   Social History Narrative    Outpatient Prescriptions Prior to Visit  Medication Sig Dispense Refill  . bacitracin 500 UNIT/GM ointment Apply 1 application topically 2 (two) times daily. 15 g 0  . cetirizine (ZYRTEC) 5 MG tablet Take 5 mg by mouth daily.    . DiphenhydrAMINE HCl (BENADRYL ALLERGY PO) Take by mouth.    . Multiple Vitamin (MULTIVITAMIN) tablet Take 1 tablet by mouth daily.    . norethindrone-ethinyl estradiol (JUNEL FE 1/20) 1-20 MG-MCG per tablet Take 1 tablet by mouth daily.      . fluconazole (DIFLUCAN) 150 MG tablet Take 1 tablet (150 mg total) by mouth once. Take 1 tablet today and repeat in 3 days as needed 2 tablet 1  . levofloxacin (LEVAQUIN) 500 MG tablet Take 1 tablet (500 mg total) by mouth daily. 5 tablet 0   No facility-administered medications prior to visit.    No Known Allergies  Review of Systems  Constitutional: Negative for fever and malaise/fatigue.  HENT: Negative for congestion.   Eyes: Negative for blurred vision.  Respiratory: Negative for shortness of breath.     Cardiovascular: Negative for chest pain, palpitations and leg swelling.  Gastrointestinal: Negative for nausea, abdominal pain and blood in stool.  Genitourinary: Negative for dysuria and frequency.  Musculoskeletal: Negative for falls.  Skin: Negative for rash.  Neurological: Negative for dizziness, loss of consciousness and headaches.  Endo/Heme/Allergies: Negative for environmental allergies.  Psychiatric/Behavioral: Negative for depression. The patient has insomnia. The patient is not nervous/anxious.        Objective:    Physical Exam  Constitutional: She is oriented to person, place, and time. She appears well-developed and well-nourished. No distress.  HENT:  Head: Normocephalic and atraumatic.  Eyes: Conjunctivae are normal.  Neck: Neck supple. No thyromegaly present.  Cardiovascular: Normal rate, regular rhythm and normal heart sounds.   No murmur heard. Pulmonary/Chest: Effort normal and breath sounds normal. No respiratory distress.  Abdominal: Soft. Bowel sounds are normal. She exhibits no distension and no mass. There is no tenderness.  Musculoskeletal: She exhibits no edema.  Lymphadenopathy:    She has no cervical adenopathy.  Neurological: She is alert and oriented to person, place, and time.  Skin: Skin is warm and dry.  Psychiatric: She has a normal mood and affect. Her behavior is normal.    BP 110/64 mmHg  Pulse 86  Temp(Src) 98.2  F (36.8 C) (Oral)  Ht 5' 8.5" (1.74 m)  Wt 159 lb (72.122 kg)  BMI 23.82 kg/m2  SpO2 99%  LMP 05/01/2015 Wt Readings from Last 3 Encounters:  05/03/15 159 lb (72.122 kg)  04/11/13 156 lb (70.761 kg)  01/28/13 156 lb (70.761 kg)     Lab Results  Component Value Date   WBC 7.5 11/23/2008   HGB 13.2 11/23/2008   HCT 37.6 11/23/2008   PLT 258.0 11/23/2008   GLUCOSE 68* 11/23/2008   CHOL 160 11/23/2008   TRIG 84.0 11/23/2008   HDL 62.50 11/23/2008   LDLCALC 81 11/23/2008   ALT 15 11/23/2008   AST 22 11/23/2008    NA 137 11/23/2008   K 4.1 11/23/2008   CL 102 11/23/2008   CREATININE 0.9 11/23/2008   BUN 9 11/23/2008   CO2 26 11/23/2008   TSH 3.19 11/23/2008    Lab Results  Component Value Date   TSH 3.19 11/23/2008   Lab Results  Component Value Date   WBC 7.5 11/23/2008   HGB 13.2 11/23/2008   HCT 37.6 11/23/2008   MCV 91.9 11/23/2008   PLT 258.0 11/23/2008   Lab Results  Component Value Date   NA 137 11/23/2008   K 4.1 11/23/2008   CO2 26 11/23/2008   GLUCOSE 68* 11/23/2008   BUN 9 11/23/2008   CREATININE 0.9 11/23/2008   BILITOT 0.9 11/23/2008   ALKPHOS 27* 11/23/2008   AST 22 11/23/2008   ALT 15 11/23/2008   PROT 6.8 11/23/2008   ALBUMIN 3.9 11/23/2008   CALCIUM 8.6 11/23/2008   Lab Results  Component Value Date   CHOL 160 11/23/2008   Lab Results  Component Value Date   HDL 62.50 11/23/2008   Lab Results  Component Value Date   LDLCALC 81 11/23/2008   Lab Results  Component Value Date   TRIG 84.0 11/23/2008   Lab Results  Component Value Date   CHOLHDL 3 11/23/2008   No results found for: HGBA1C     Assessment & Plan:   Problem List Items Addressed This Visit    None    Visit Diagnoses    Need for Tdap vaccination    -  Primary    Relevant Orders    Tdap vaccine greater than or equal to 7yo IM       I have discontinued Holly Bailey's fluconazole and levofloxacin. I am also having her maintain her norethindrone-ethinyl estradiol, multivitamin, cetirizine, DiphenhydrAMINE HCl (BENADRYL ALLERGY PO), and bacitracin.  No orders of the defined types were placed in this encounter.     Dayna BarkerBrittany Izabellah Dadisman, RMA

## 2015-05-03 NOTE — Progress Notes (Signed)
Pre visit review using our clinic review tool, if applicable. No additional management support is needed unless otherwise documented below in the visit note. 

## 2015-05-03 NOTE — Progress Notes (Signed)
Subjective:     Holly Bailey is a 50 y.o. female and is here for a comprehensive physical exam. The patient reports problems - restless sleeping and leg cramps and she is concerned about her thyroid.  Social History   Social History  . Marital Status: Married    Spouse Name: N/A  . Number of Children: N/A  . Years of Education: N/A   Occupational History  . Not on file.   Social History Main Topics  . Smoking status: Never Smoker   . Smokeless tobacco: Never Used  . Alcohol Use: Not on file  . Drug Use: Not on file  . Sexual Activity: Not on file   Other Topics Concern  . Not on file   Social History Narrative   Health Maintenance  Topic Date Due  . HIV Screening  10/17/1982  . INFLUENZA VACCINE  08/23/2015  . MAMMOGRAM  03/31/2016  . PAP SMEAR  03/23/2018  . TETANUS/TDAP  05/02/2025    The following portions of the patient's history were reviewed and updated as appropriate:  She  has no past medical history on file. She  does not have any pertinent problems on file. She  has no past surgical history on file. Her family history includes Hypertension in her paternal grandfather; Macular degeneration in her father; Multiple sclerosis in her mother. She  reports that she has never smoked. She has never used smokeless tobacco. She reports that she drinks about 6.0 oz of alcohol per week. She reports that she does not use illicit drugs. She has a current medication list which includes the following prescription(s): bacitracin, cetirizine, diphenhydramine hcl, multivitamin, and norethindrone-ethinyl estradiol. Current Outpatient Prescriptions on File Prior to Visit  Medication Sig Dispense Refill  . bacitracin 500 UNIT/GM ointment Apply 1 application topically 2 (two) times daily. 15 g 0  . cetirizine (ZYRTEC) 5 MG tablet Take 5 mg by mouth daily.    . DiphenhydrAMINE HCl (BENADRYL ALLERGY PO) Take by mouth.    . Multiple Vitamin (MULTIVITAMIN) tablet Take 1 tablet by  mouth daily.    . norethindrone-ethinyl estradiol (JUNEL FE 1/20) 1-20 MG-MCG per tablet Take 1 tablet by mouth daily.       No current facility-administered medications on file prior to visit.   She has No Known Allergies.. ekg- nsr Review of Systems Review of Systems  Constitutional: Negative for activity change, appetite change and fatigue.  HENT: Negative for hearing loss, congestion, tinnitus and ear discharge.  dentist q54mEyes: Negative for visual disturbance (see optho q1y -- vision corrected to 20/20 with glasses).  Respiratory: Negative for cough, chest tightness and shortness of breath.   Cardiovascular: Negative for chest pain, palpitations and leg swelling.  Gastrointestinal: Negative for abdominal pain, diarrhea, constipation and abdominal distention.  Genitourinary: Negative for urgency, frequency, decreased urine volume and difficulty urinating.  Musculoskeletal: Negative for back pain, arthralgias and gait problem.  Skin: Negative for color change, pallor and rash.  Neurological: Negative for dizziness, light-headedness, numbness and headaches.  Hematological: Negative for adenopathy. Does not bruise/bleed easily.  Psychiatric/Behavioral: Negative for suicidal ideas, confusion, sleep disturbance, self-injury, dysphoric mood, decreased concentration and agitation.       Objective:    BP 110/64 mmHg  Pulse 86  Temp(Src) 98.2 F (36.8 C) (Oral)  Ht 5' 8.5" (1.74 m)  Wt 159 lb (72.122 kg)  BMI 23.82 kg/m2  SpO2 99%  LMP 05/01/2015 General appearance: alert, cooperative, appears stated age and no distress Head: Normocephalic, without  obvious abnormality, atraumatic Eyes: conjunctivae/corneas clear. PERRL, EOM's intact. Fundi benign. Ears: normal TM's and external ear canals both ears Nose: Nares normal. Septum midline. Mucosa normal. No drainage or sinus tenderness. Throat: lips, mucosa, and tongue normal; teeth and gums normal Neck: no carotid bruit, no JVD,  supple, symmetrical, trachea midline and thyroid not enlarged, symmetric, no tenderness/mass/nodules Back: symmetric, no curvature. ROM normal. No CVA tenderness. Lungs: clear to auscultation bilaterally Breasts: gyn Heart: regular rate and rhythm, S1, S2 normal, no murmur, click, rub or gallop Abdomen: soft, non-tender; bowel sounds normal; no masses,  no organomegaly Pelvic: deferred Extremities: extremities normal, atraumatic, no cyanosis or edema Pulses: 2+ and symmetric Skin: Skin color, texture, turgor normal. No rashes or lesions Lymph nodes: Cervical, supraclavicular, and axillary nodes normal. Neurologic: Alert and oriented X 3, normal strength and tone. Normal symmetric reflexes. Normal coordination and gait Psych- no depression, no anxiety      Assessment:    Healthy female exam.       Plan:    ghm utd Check labs See After Visit Summary for Counseling Recommendations    1. Need for Tdap vaccination   - Tdap vaccine greater than or equal to 7yo IM  2. Preventative health care See above - TSH - T3 - T4, free - Lipid panel - Comp Met (CMET) - CBC - EKG 12-Lead  3. Amenorrhea   - FSH - Estradiol - Luteinizing hormone  4. Chronic fatigue   - EKG 12-Lead

## 2015-05-11 LAB — ESTRADIOL, FREE
ESTRADIOL: 4 pg/mL
Estradiol, Free: 0.05 pg/mL

## 2015-07-29 ENCOUNTER — Encounter: Payer: BLUE CROSS/BLUE SHIELD | Admitting: Family Medicine

## 2015-12-17 DIAGNOSIS — H10023 Other mucopurulent conjunctivitis, bilateral: Secondary | ICD-10-CM | POA: Diagnosis not present

## 2015-12-27 DIAGNOSIS — H1089 Other conjunctivitis: Secondary | ICD-10-CM | POA: Diagnosis not present

## 2016-01-23 HISTORY — PX: FINGER FRACTURE SURGERY: SHX638

## 2016-01-27 DIAGNOSIS — H1045 Other chronic allergic conjunctivitis: Secondary | ICD-10-CM | POA: Diagnosis not present

## 2016-02-17 DIAGNOSIS — Z01419 Encounter for gynecological examination (general) (routine) without abnormal findings: Secondary | ICD-10-CM | POA: Diagnosis not present

## 2016-03-05 ENCOUNTER — Other Ambulatory Visit: Payer: Self-pay | Admitting: Family Medicine

## 2016-03-05 DIAGNOSIS — Z1231 Encounter for screening mammogram for malignant neoplasm of breast: Secondary | ICD-10-CM

## 2016-04-06 ENCOUNTER — Ambulatory Visit: Payer: BLUE CROSS/BLUE SHIELD

## 2016-05-04 ENCOUNTER — Ambulatory Visit: Payer: BLUE CROSS/BLUE SHIELD

## 2016-05-09 ENCOUNTER — Ambulatory Visit
Admission: RE | Admit: 2016-05-09 | Discharge: 2016-05-09 | Disposition: A | Discharge status: Home / Self Care | Payer: BLUE CROSS/BLUE SHIELD | Source: Ambulatory Visit | Attending: Family Medicine | Admitting: Family Medicine

## 2016-05-09 DIAGNOSIS — Z1231 Encounter for screening mammogram for malignant neoplasm of breast: Secondary | ICD-10-CM

## 2016-06-01 DIAGNOSIS — D1801 Hemangioma of skin and subcutaneous tissue: Secondary | ICD-10-CM | POA: Diagnosis not present

## 2016-06-01 DIAGNOSIS — S62617A Displaced fracture of proximal phalanx of left little finger, initial encounter for closed fracture: Secondary | ICD-10-CM | POA: Diagnosis not present

## 2016-06-01 DIAGNOSIS — L820 Inflamed seborrheic keratosis: Secondary | ICD-10-CM | POA: Diagnosis not present

## 2016-06-01 DIAGNOSIS — S6992XA Unspecified injury of left wrist, hand and finger(s), initial encounter: Secondary | ICD-10-CM | POA: Diagnosis not present

## 2016-06-01 DIAGNOSIS — L821 Other seborrheic keratosis: Secondary | ICD-10-CM | POA: Diagnosis not present

## 2016-06-04 DIAGNOSIS — S62617A Displaced fracture of proximal phalanx of left little finger, initial encounter for closed fracture: Secondary | ICD-10-CM | POA: Diagnosis not present

## 2016-06-14 DIAGNOSIS — S62617A Displaced fracture of proximal phalanx of left little finger, initial encounter for closed fracture: Secondary | ICD-10-CM | POA: Diagnosis not present

## 2016-06-14 DIAGNOSIS — Z09 Encounter for follow-up examination after completed treatment for conditions other than malignant neoplasm: Secondary | ICD-10-CM | POA: Diagnosis not present

## 2016-07-05 DIAGNOSIS — Z09 Encounter for follow-up examination after completed treatment for conditions other than malignant neoplasm: Secondary | ICD-10-CM | POA: Diagnosis not present

## 2016-07-17 DIAGNOSIS — Z4789 Encounter for other orthopedic aftercare: Secondary | ICD-10-CM | POA: Diagnosis not present

## 2017-02-22 DIAGNOSIS — Z130 Encounter for screening for diseases of the blood and blood-forming organs and certain disorders involving the immune mechanism: Secondary | ICD-10-CM | POA: Diagnosis not present

## 2017-02-22 DIAGNOSIS — Z6824 Body mass index (BMI) 24.0-24.9, adult: Secondary | ICD-10-CM | POA: Diagnosis not present

## 2017-02-22 DIAGNOSIS — Z1151 Encounter for screening for human papillomavirus (HPV): Secondary | ICD-10-CM | POA: Diagnosis not present

## 2017-02-22 DIAGNOSIS — Z0000 Encounter for general adult medical examination without abnormal findings: Secondary | ICD-10-CM | POA: Diagnosis not present

## 2017-02-22 DIAGNOSIS — Z1329 Encounter for screening for other suspected endocrine disorder: Secondary | ICD-10-CM | POA: Diagnosis not present

## 2017-02-22 DIAGNOSIS — Z01419 Encounter for gynecological examination (general) (routine) without abnormal findings: Secondary | ICD-10-CM | POA: Diagnosis not present

## 2017-04-10 ENCOUNTER — Other Ambulatory Visit: Payer: Self-pay | Admitting: Family Medicine

## 2017-04-10 DIAGNOSIS — Z1231 Encounter for screening mammogram for malignant neoplasm of breast: Secondary | ICD-10-CM

## 2017-05-13 ENCOUNTER — Ambulatory Visit
Admission: RE | Admit: 2017-05-13 | Discharge: 2017-05-13 | Disposition: A | Discharge status: Home / Self Care | Payer: BLUE CROSS/BLUE SHIELD | Source: Ambulatory Visit | Attending: Family Medicine | Admitting: Family Medicine

## 2017-05-13 DIAGNOSIS — Z1231 Encounter for screening mammogram for malignant neoplasm of breast: Secondary | ICD-10-CM

## 2017-05-17 DIAGNOSIS — L738 Other specified follicular disorders: Secondary | ICD-10-CM | POA: Diagnosis not present

## 2017-05-17 DIAGNOSIS — G5762 Lesion of plantar nerve, left lower limb: Secondary | ICD-10-CM | POA: Diagnosis not present

## 2017-05-17 DIAGNOSIS — M79672 Pain in left foot: Secondary | ICD-10-CM | POA: Diagnosis not present

## 2017-05-17 DIAGNOSIS — M2042 Other hammer toe(s) (acquired), left foot: Secondary | ICD-10-CM | POA: Diagnosis not present

## 2017-05-19 ENCOUNTER — Encounter: Payer: Self-pay | Admitting: Family Medicine

## 2017-05-20 NOTE — Telephone Encounter (Signed)
Yes it can change with menopause---- hormonal changes .

## 2017-05-22 DIAGNOSIS — K120 Recurrent oral aphthae: Secondary | ICD-10-CM | POA: Diagnosis not present

## 2017-05-22 DIAGNOSIS — B001 Herpesviral vesicular dermatitis: Secondary | ICD-10-CM | POA: Diagnosis not present

## 2017-05-22 DIAGNOSIS — L0889 Other specified local infections of the skin and subcutaneous tissue: Secondary | ICD-10-CM | POA: Diagnosis not present

## 2017-11-22 DIAGNOSIS — L739 Follicular disorder, unspecified: Secondary | ICD-10-CM | POA: Diagnosis not present

## 2017-11-22 DIAGNOSIS — R300 Dysuria: Secondary | ICD-10-CM | POA: Diagnosis not present

## 2017-11-22 DIAGNOSIS — L0102 Bockhart's impetigo: Secondary | ICD-10-CM | POA: Diagnosis not present

## 2018-02-24 DIAGNOSIS — Z1151 Encounter for screening for human papillomavirus (HPV): Secondary | ICD-10-CM | POA: Diagnosis not present

## 2018-02-24 DIAGNOSIS — Z6824 Body mass index (BMI) 24.0-24.9, adult: Secondary | ICD-10-CM | POA: Diagnosis not present

## 2018-02-24 DIAGNOSIS — Z23 Encounter for immunization: Secondary | ICD-10-CM | POA: Diagnosis not present

## 2018-02-24 DIAGNOSIS — Z113 Encounter for screening for infections with a predominantly sexual mode of transmission: Secondary | ICD-10-CM | POA: Diagnosis not present

## 2018-02-24 DIAGNOSIS — Z01419 Encounter for gynecological examination (general) (routine) without abnormal findings: Secondary | ICD-10-CM | POA: Diagnosis not present

## 2018-02-25 ENCOUNTER — Encounter: Payer: Self-pay | Admitting: Gastroenterology

## 2018-03-14 ENCOUNTER — Ambulatory Visit (AMBULATORY_SURGERY_CENTER): Payer: Self-pay

## 2018-03-14 ENCOUNTER — Encounter: Payer: Self-pay | Admitting: Gastroenterology

## 2018-03-14 VITALS — Ht 68.0 in | Wt 163.0 lb

## 2018-03-14 DIAGNOSIS — Z1211 Encounter for screening for malignant neoplasm of colon: Secondary | ICD-10-CM

## 2018-03-14 MED ORDER — NA SULFATE-K SULFATE-MG SULF 17.5-3.13-1.6 GM/177ML PO SOLN: 1.0000 | Freq: Once | ORAL | 0 refills | Status: AC

## 2018-03-14 NOTE — Progress Notes (Signed)
No egg or soy allergy known to patient  No issues with past sedation with any surgeries  or procedures, no intubation problems  No diet pills per patient No home 02 use per patient  No blood thinners per patient  Pt denies issues with constipation  No A fib or A flutter  EMMI video sent to pt's e mail Pt accept/

## 2018-03-28 ENCOUNTER — Other Ambulatory Visit: Payer: Self-pay

## 2018-03-28 ENCOUNTER — Ambulatory Visit (AMBULATORY_SURGERY_CENTER): Payer: BLUE CROSS/BLUE SHIELD | Admitting: Gastroenterology

## 2018-03-28 ENCOUNTER — Encounter: Payer: Self-pay | Admitting: Gastroenterology

## 2018-03-28 VITALS — BP 91/55 | HR 69 | Temp 98.4°F | Resp 16 | Ht 68.0 in | Wt 163.0 lb

## 2018-03-28 DIAGNOSIS — Z1211 Encounter for screening for malignant neoplasm of colon: Secondary | ICD-10-CM

## 2018-03-28 MED ORDER — SODIUM CHLORIDE 0.9 % IV SOLN: 500.0000 mL | Freq: Once | INTRAVENOUS | Status: DC

## 2018-03-28 NOTE — Progress Notes (Signed)
PT taken to PACU. Monitors in place. VSS. Report given to RN. 

## 2018-03-28 NOTE — Op Note (Signed)
[facility] Patient Name: Holly Bailey Procedure Date: 03/28/2018 7:40 AM MRN: [id number] Endoscopist: Viviann Spare P. Adela Lank , MD Age: 53 Referring MD:  Date of Birth: 11/04/1965 Gender: Female Account #: [id number] Procedure:                Colonoscopy Indications:              Screening for colorectal malignant neoplasm, This                            is the patient's first colonoscopy Medicines:                Monitored Anesthesia Care Procedure:                Pre-Anesthesia Assessment:                           - Prior to the procedure, a History and Physical                            was performed, and patient medications and                            allergies were reviewed. The patient's tolerance of                            previous anesthesia was also reviewed. The risks                            and benefits of the procedure and the sedation                            options and risks were discussed with the patient.                            All questions were answered, and informed consent                            was obtained. Prior Anticoagulants: The patient has                            taken no previous anticoagulant or antiplatelet                            agents. ASA Grade Assessment: I - A normal, healthy                            patient. After reviewing the risks and benefits,                            the patient was deemed in satisfactory condition to                            undergo the procedure.  After obtaining informed consent, the colonoscope                            was passed under direct vision. Throughout the                            procedure, the patient's blood pressure, pulse, and                            oxygen saturations were monitored continuously. The                            Colonoscope was introduced through the anus and                            advanced to the the cecum,  identified by                            appendiceal orifice and ileocecal valve. The                            colonoscopy was performed without difficulty. The                            patient tolerated the procedure well. The quality                            of the bowel preparation was good. The ileocecal                            valve, appendiceal orifice, and rectum were                            photographed. Scope In: 7:58:21 AM Scope Out: 8:22:00 AM Scope Withdrawal Time: 0 hours 16 minutes 35 seconds  Total Procedure Duration: 0 hours 23 minutes 39 seconds  Findings:                 The perianal and digital rectal examinations were                            normal.                           The colon was extremely tortuous with significant                            looping, prolonging this procedure.                           The exam was otherwise without abnormality. Complications:            No immediate complications. Estimated blood loss:                            None. Estimated Blood Loss:  Estimated blood loss: none. Impression:               - Tortuous colon.                           - The examination was otherwise normal.                           - No polyps Recommendation:           - Patient has a contact number available for                            emergencies. The signs and symptoms of potential                            delayed complications were discussed with the                            patient. Return to normal activities tomorrow.                            Written discharge instructions were provided to the                            patient.                           - Resume previous diet.                           - Continue present medications.                           - Repeat colonoscopy in 10 years for screening                            purposes. Viviann Spare P. Armbruster, MD 03/28/2018 8:25:53 AM This report has been signed  electronically.

## 2018-03-28 NOTE — Progress Notes (Signed)
Pt's states no medical or surgical changes since previsit or office visit. 

## 2018-03-28 NOTE — Patient Instructions (Signed)
Normal colonoscopy today so you don't need to repeat for 10 years.  (just be aware that you have a tortuous/twisty colon) Please start out with a light meal with low gas producing foods such as eggs, grits, toast, pancakes/waffles, or lean meat. Absolutely no alcohol or driving until tomorrow and please report any of the below listed symptoms immediately.   YOU HAD AN ENDOSCOPIC PROCEDURE TODAY AT THE [facility]:   Refer to the procedure report that was given to you for any specific questions about what was found during the examination.  If the procedure report does not answer your questions, please call your gastroenterologist to clarify.  If you requested that your care partner not be given the details of your procedure findings, then the procedure report has been included in a sealed envelope for you to review at your convenience later.  YOU SHOULD EXPECT: Some feelings of bloating in the abdomen. Passage of more gas than usual.  Walking can help get rid of the air that was put into your GI tract during the procedure and reduce the bloating. If you had a lower endoscopy (such as a colonoscopy or flexible sigmoidoscopy) you may notice spotting of blood in your stool or on the toilet paper. If you underwent a bowel prep for your procedure, you may not have a normal bowel movement for a few days.  Please Note:  You might notice some irritation and congestion in your nose or some drainage.  This is from the oxygen used during your procedure.  There is no need for concern and it should clear up in a day or so.  SYMPTOMS TO REPORT IMMEDIATELY:   Following lower endoscopy (colonoscopy or flexible sigmoidoscopy):  Excessive amounts of blood in the stool  Significant tenderness or worsening of abdominal pains  Swelling of the abdomen that is new, acute  Fever of 100F or higher   For urgent or emergent issues, a gastroenterologist can be reached at any hour by calling [phone]  [phone].   DIET:  We do recommend a small meal at first, but then you may proceed to your regular diet.  Drink plenty of fluids but you should avoid alcoholic beverages for 24 hours.  ACTIVITY:  You should plan to take it easy for the rest of today and you should NOT DRIVE or use heavy machinery until tomorrow (because of the sedation medicines used during the test).    FOLLOW UP: Our staff will call the number listed on your records the next business day following your procedure to check on you and address any questions or concerns that you may have regarding the information given to you following your procedure. If we do not reach you, we will leave a message.  However, if you are feeling well and you are not experiencing any problems, there is no need to return our call.  We will assume that you have returned to your regular daily activities without incident.  If any biopsies were taken you will be contacted by phone or by letter within the next 1-3 weeks.  Please call us at [phone] if you have not heard about the biopsies in 3 weeks.    SIGNATURES/CONFIDENTIALITY: You and/or your care partner have signed paperwork which will be entered into your electronic medical record.  These signatures attest to the fact that that the information above on your After Visit Summary has been reviewed and is understood.  Full responsibility of the confidentiality of this discharge information  lies with you and/or your care-partner.

## 2018-03-28 NOTE — Progress Notes (Signed)
40

## 2018-03-31 ENCOUNTER — Telehealth: Payer: Self-pay

## 2018-03-31 NOTE — Telephone Encounter (Signed)
  Follow up Call-  Call back number 03/28/2018  Post procedure Call Back phone  # [phone]  Permission to leave phone message Yes  Some recent data might be hidden     Patient questions:  Do you have a fever, pain , or abdominal swelling? No. Pain Score  0 *  Have you tolerated food without any problems? Yes.    Have you been able to return to your normal activities? Yes.    Do you have any questions about your discharge instructions: Diet   No. Medications  No. Follow up visit  No.  Do you have questions or concerns about your Care? No.  Actions: * If pain score is 4 or above: No action needed, pain <4.

## 2018-04-01 ENCOUNTER — Other Ambulatory Visit: Payer: Self-pay | Admitting: Family Medicine

## 2018-04-01 DIAGNOSIS — Z1231 Encounter for screening mammogram for malignant neoplasm of breast: Secondary | ICD-10-CM

## 2018-04-08 ENCOUNTER — Encounter: Payer: Self-pay | Admitting: Family Medicine

## 2018-04-08 ENCOUNTER — Ambulatory Visit (INDEPENDENT_AMBULATORY_CARE_PROVIDER_SITE_OTHER): Payer: BLUE CROSS/BLUE SHIELD | Admitting: Family Medicine

## 2018-04-08 ENCOUNTER — Other Ambulatory Visit: Payer: Self-pay

## 2018-04-08 VITALS — BP 126/68 | HR 67 | Temp 98.0°F | Resp 16 | Ht 68.0 in | Wt 156.1 lb

## 2018-04-08 DIAGNOSIS — Z0000 Encounter for general adult medical examination without abnormal findings: Secondary | ICD-10-CM

## 2018-04-08 DIAGNOSIS — G4700 Insomnia, unspecified: Secondary | ICD-10-CM | POA: Diagnosis not present

## 2018-04-08 MED ORDER — TRAZODONE HCL 50 MG PO TABS: 25.0000 mg | ORAL_TABLET | Freq: Every evening | ORAL | 3 refills | Status: DC | PRN

## 2018-04-08 MED ORDER — RAMELTEON 8 MG PO TABS: 8.0000 mg | ORAL_TABLET | Freq: Every day | ORAL | 2 refills | Status: DC

## 2018-04-08 NOTE — Progress Notes (Signed)
Pre visit review using our clinic review tool, if applicable. No additional management support is needed unless otherwise documented below in the visit note. 

## 2018-04-08 NOTE — Patient Instructions (Signed)
Preventive Care 40-64 Years, Female Preventive care refers to lifestyle choices and visits with your health care provider that can promote health and wellness. What does preventive care include?   A yearly physical exam. This is also called an annual well check.  Dental exams once or twice a year.  Routine eye exams. Ask your health care provider how often you should have your eyes checked.  Personal lifestyle choices, including: ? Daily care of your teeth and gums. ? Regular physical activity. ? Eating a healthy diet. ? Avoiding tobacco and drug use. ? Limiting alcohol use. ? Practicing safe sex. ? Taking low-dose aspirin daily starting at age 57. ? Taking vitamin and mineral supplements as recommended by your health care provider. What happens during an annual well check? The services and screenings done by your health care provider during your annual well check will depend on your age, overall health, lifestyle risk factors, and family history of disease. Counseling Your health care provider may ask you questions about your:  Alcohol use.  Tobacco use.  Drug use.  Emotional well-being.  Home and relationship well-being.  Sexual activity.  Eating habits.  Work and work Statistician.  Method of birth control.  Menstrual cycle.  Pregnancy history. Screening You may have the following tests or measurements:  Height, weight, and BMI.  Blood pressure.  Lipid and cholesterol levels. These may be checked every 5 years, or more frequently if you are over 82 years old.  Skin check.  Lung cancer screening. You may have this screening every year starting at age 42 if you have a 30-pack-year history of smoking and currently smoke or have quit within the past 15 years.  Colorectal cancer screening. All adults should have this screening starting at age 11 and continuing until age 22. Your health care provider may recommend screening at age 47. You will have tests every  1-10 years, depending on your results and the type of screening test. People at increased risk should start screening at an earlier age. Screening tests may include: ? Guaiac-based fecal occult blood testing. ? Fecal immunochemical test (FIT). ? Stool DNA test. ? Virtual colonoscopy. ? Sigmoidoscopy. During this test, a flexible tube with a tiny camera (sigmoidoscope) is used to examine your rectum and lower colon. The sigmoidoscope is inserted through your anus into your rectum and lower colon. ? Colonoscopy. During this test, a long, thin, flexible tube with a tiny camera (colonoscope) is used to examine your entire colon and rectum.  Hepatitis C blood test.  Hepatitis B blood test.  Sexually transmitted disease (STD) testing.  Diabetes screening. This is done by checking your blood sugar (glucose) after you have not eaten for a while (fasting). You may have this done every 1-3 years.  Mammogram. This may be done every 1-2 years. Talk to your health care provider about when you should start having regular mammograms. This may depend on whether you have a family history of breast cancer.  BRCA-related cancer screening. This may be done if you have a family history of breast, ovarian, tubal, or peritoneal cancers.  Pelvic exam and Pap test. This may be done every 3 years starting at age 31. Starting at age 71, this may be done every 5 years if you have a Pap test in combination with an HPV test.  Bone density scan. This is done to screen for osteoporosis. You may have this scan if you are at high risk for osteoporosis. Discuss your test results, treatment options,  and if necessary, the need for more tests with your health care provider. Vaccines Your health care provider may recommend certain vaccines, such as:  Influenza vaccine. This is recommended every year.  Tetanus, diphtheria, and acellular pertussis (Tdap, Td) vaccine. You may need a Td booster every 10 years.  Varicella  vaccine. You may need this if you have not been vaccinated.  Zoster vaccine. You may need this after age 41.  Measles, mumps, and rubella (MMR) vaccine. You may need at least one dose of MMR if you were born in 1957 or later. You may also need a second dose.  Pneumococcal 13-valent conjugate (PCV13) vaccine. You may need this if you have certain conditions and were not previously vaccinated.  Pneumococcal polysaccharide (PPSV23) vaccine. You may need one or two doses if you smoke cigarettes or if you have certain conditions.  Meningococcal vaccine. You may need this if you have certain conditions.  Hepatitis A vaccine. You may need this if you have certain conditions or if you travel or work in places where you may be exposed to hepatitis A.  Hepatitis B vaccine. You may need this if you have certain conditions or if you travel or work in places where you may be exposed to hepatitis B.  Haemophilus influenzae type b (Hib) vaccine. You may need this if you have certain conditions. Talk to your health care provider about which screenings and vaccines you need and how often you need them. This information is not intended to replace advice given to you by your health care provider. Make sure you discuss any questions you have with your health care provider. Document Released: 02/04/2015 Document Revised: 02/28/2017 Document Reviewed: 11/09/2014 Elsevier Interactive Patient Education  2019 [employer].

## 2018-04-08 NOTE — Progress Notes (Signed)
Subjective:     Holly Bailey is a 53 y.o. female and is here for a comprehensive physical exam. The patient reports problems - insomnia.  Social History   Socioeconomic History  . Marital status: Married    Spouse name: Not on file  . Number of children: Not on file  . Years of education: Not on file  . Highest education level: Not on file  Occupational History    Comment: [employer] for [employer]   Social Needs  . Financial resource strain: Not on file  . Food insecurity:    Worry: Not on file    Inability: Not on file  . Transportation needs:    Medical: Not on file    Non-medical: Not on file  Tobacco Use  . Smoking status: Never Smoker  . Smokeless tobacco: Never Used  Substance and Sexual Activity  . Alcohol use: Yes    Alcohol/week: 10.0 standard drinks    Types: 10 Glasses of wine per week  . Drug use: No  . Sexual activity: Yes    Partners: Male  Lifestyle  . Physical activity:    Days per week: Not on file    Minutes per session: Not on file  . Stress: Not on file  Relationships  . Social connections:    Talks on phone: Not on file    Gets together: Not on file    Attends religious service: Not on file    Active member of club or organization: Not on file    Attends meetings of clubs or organizations: Not on file    Relationship status: Not on file  . Intimate partner violence:    Fear of current or ex partner: Not on file    Emotionally abused: Not on file    Physically abused: Not on file    Forced sexual activity: Not on file  Other Topics Concern  . Not on file  Social History Narrative   Exercise--  Walking dog 1-2 miles a day   Health Maintenance  Topic Date Due  . HIV Screening  10/17/1982  . INFLUENZA VACCINE  08/22/2017  . PAP SMEAR-Modifier  03/23/2018  . MAMMOGRAM  05/14/2018  . TETANUS/TDAP  05/02/2025  . COLONOSCOPY  03/27/2028    The following portions of the patient's history were reviewed and updated as appropriate:  She  has  a past medical history of Allergy. She does not have any pertinent problems on file. She  has a past surgical history that includes Finger fracture surgery (Left, 2018). Her family history includes Colon cancer in her paternal grandmother; Hypertension in her paternal grandfather; Macular degeneration in her father; Multiple sclerosis in her mother. She  reports that she has never smoked. She has never used smokeless tobacco. She reports current alcohol use of about 10.0 standard drinks of alcohol per week. She reports that she does not use drugs. She has a current medication list which includes the following prescription(s): cetirizine, diphenhydramine hcl, ibuprofen, multivitamin, norethindrone-ethinyl estradiol, acetaminophen, bacitracin, and trazodone. Current Outpatient Medications on File Prior to Visit  Medication Sig Dispense Refill  . cetirizine (ZYRTEC) 5 MG tablet Take 5 mg by mouth daily.    . DiphenhydrAMINE HCl (BENADRYL ALLERGY PO) Take by mouth.    Marland Kitchen ibuprofen (ADVIL,MOTRIN) 400 MG tablet Take by mouth.    . Multiple Vitamin (MULTIVITAMIN) tablet Take 1 tablet by mouth daily.    . norethindrone-ethinyl estradiol (JUNEL FE 1/20) 1-20 MG-MCG per tablet Take 1  tablet by mouth daily.      Marland Kitchen acetaminophen (TYLENOL) 500 MG tablet Take by mouth.    . bacitracin 500 UNIT/GM ointment Apply 1 application topically 2 (two) times daily. (Patient not taking: Reported on 03/14/2018) 15 g 0   No current facility-administered medications on file prior to visit.    She has No Known Allergies..  Review of Systems Review of Systems  Constitutional: Negative for activity change, appetite change and fatigue.  HENT: Negative for hearing loss, congestion, tinnitus and ear discharge.  dentist q27m Eyes: Negative for visual disturbance (see optho q1y -- vision corrected to 20/20 with glasses).  Respiratory: Negative for cough, chest tightness and shortness of breath.   Cardiovascular: Negative for  chest pain, palpitations and leg swelling.  Gastrointestinal: Negative for abdominal pain, diarrhea, constipation and abdominal distention.  Genitourinary: Negative for urgency, frequency, decreased urine volume and difficulty urinating.  Musculoskeletal: Negative for back pain, arthralgias and gait problem.  Skin: Negative for color change, pallor and rash.  Neurological: Negative for dizziness, light-headedness, numbness and headaches.  Hematological: Negative for adenopathy. Does not bruise/bleed easily.  Psychiatric/Behavioral: Negative for suicidal ideas, confusion, sleep disturbance, self-injury, dysphoric mood, decreased concentration and agitation.       Objective:    BP 126/68 (BP Location: Left Arm, Patient Position: Sitting, Cuff Size: Small)   Pulse 67   Temp 98 F (36.7 C) (Oral)   Resp 16   Ht 5\' 8"  (1.727 m)   Wt 156 lb 2 oz (70.8 kg)   SpO2 98%   BMI 23.74 kg/m  General appearance: alert, cooperative, appears stated age and no distress Head: Normocephalic, without obvious abnormality, atraumatic Eyes: conjunctivae/corneas clear. PERRL, EOM's intact. Fundi benign. Ears: normal TM's and external ear canals both ears Nose: Nares normal. Septum midline. Mucosa normal. No drainage or sinus tenderness. Throat: lips, mucosa, and tongue normal; teeth and gums normal Neck: no adenopathy, no carotid bruit, no JVD, supple, symmetrical, trachea midline and thyroid not enlarged, symmetric, no tenderness/mass/nodules Back: symmetric, no curvature. ROM normal. No CVA tenderness. Lungs: clear to auscultation bilaterally Breasts: gyn Heart: regular rate and rhythm, S1, S2 normal, no murmur, click, rub or gallop Abdomen: soft, non-tender; bowel sounds normal; no masses,  no organomegaly Pelvic: deferred--gyn Extremities: extremities normal, atraumatic, no cyanosis or edema Pulses: 2+ and symmetric Skin: Skin color, texture, turgor normal. No rashes or lesions Lymph nodes:  Cervical, supraclavicular, and axillary nodes normal. Neurologic: Alert and oriented X 3, normal strength and tone. Normal symmetric reflexes. Normal coordination and gait    Assessment:    Healthy female exam.      Plan:    ghm utd Check labs  See After Visit Summary for Counseling Recommendations    1. Insomnia, unspecified type No tv, food in bed  Consider sleep eval if no better - Lipid panel - CBC with Differential/Platelet - Comprehensive metabolic panel - Thyroid Panel With TSH - ramelteon (ROZEREM) 8 MG tablet; Take 1 tablet (8 mg total) by mouth at bedtime.  Dispense: 30 tablet; Refill: 2  2. Preventative health care See above - Lipid panel - CBC with Differential/Platelet - Comprehensive metabolic panel - Thyroid Panel With TSH

## 2018-04-09 LAB — CBC WITH DIFFERENTIAL/PLATELET
BASOS PCT: 1.1 % (ref 0.0–3.0)
Basophils Absolute: 0.1 10*3/uL (ref 0.0–0.1)
EOS ABS: 0.1 10*3/uL (ref 0.0–0.7)
Eosinophils Relative: 1 % (ref 0.0–5.0)
HCT: 41.4 % (ref 36.0–46.0)
Hemoglobin: 14.1 g/dL (ref 12.0–15.0)
Lymphocytes Relative: 39.1 % (ref 12.0–46.0)
Lymphs Abs: 3 10*3/uL (ref 0.7–4.0)
MCHC: 34 g/dL (ref 30.0–36.0)
MCV: 89.2 fl (ref 78.0–100.0)
Monocytes Absolute: 0.7 10*3/uL (ref 0.1–1.0)
Monocytes Relative: 9.2 % (ref 3.0–12.0)
NEUTROS ABS: 3.8 10*3/uL (ref 1.4–7.7)
Neutrophils Relative %: 49.6 % (ref 43.0–77.0)
PLATELETS: 308 10*3/uL (ref 150.0–400.0)
RBC: 4.65 Mil/uL (ref 3.87–5.11)
RDW: 13.1 % (ref 11.5–15.5)
WBC: 7.6 10*3/uL (ref 4.0–10.5)

## 2018-04-09 LAB — COMPREHENSIVE METABOLIC PANEL
ALT: 23 U/L (ref 0–35)
AST: 25 U/L (ref 0–37)
Albumin: 4.6 g/dL (ref 3.5–5.2)
Alkaline Phosphatase: 53 U/L (ref 39–117)
BUN: 11 mg/dL (ref 6–23)
CO2: 29 mEq/L (ref 19–32)
Calcium: 9.9 mg/dL (ref 8.4–10.5)
Chloride: 101 mEq/L (ref 96–112)
Creatinine, Ser: 0.88 mg/dL (ref 0.40–1.20)
GFR: 67.89 mL/min (ref >60.00)
GLUCOSE: 93 mg/dL (ref 70–99)
Potassium: 4 mEq/L (ref 3.5–5.1)
SODIUM: 139 meq/L (ref 135–145)
Total Bilirubin: 0.8 mg/dL (ref 0.2–1.2)
Total Protein: 7.2 g/dL (ref 6.0–8.3)

## 2018-04-09 LAB — LIPID PANEL
Cholesterol: 195 mg/dL (ref 0–200)
HDL: 74.7 mg/dL (ref >39.00)
LDL CALC: 107 mg/dL — AB (ref 0–99)
NonHDL: 120.31
TRIGLYCERIDES: 65 mg/dL (ref 0.0–149.0)
Total CHOL/HDL Ratio: 3
VLDL: 13 mg/dL (ref 0.0–40.0)

## 2018-04-09 LAB — THYROID PANEL WITH TSH
Free Thyroxine Index: 2.4 (ref 1.4–3.8)
T3 Uptake: 29 % (ref 22–35)
T4, Total: 8.2 ug/dL (ref 5.1–11.9)
TSH: 2.49 mIU/L

## 2018-04-14 DIAGNOSIS — Z3041 Encounter for surveillance of contraceptive pills: Secondary | ICD-10-CM | POA: Diagnosis not present

## 2018-05-15 ENCOUNTER — Ambulatory Visit: Payer: BLUE CROSS/BLUE SHIELD

## 2018-07-02 ENCOUNTER — Other Ambulatory Visit: Payer: Self-pay

## 2018-07-02 ENCOUNTER — Ambulatory Visit
Admission: RE | Admit: 2018-07-02 | Discharge: 2018-07-02 | Disposition: A | Discharge status: Home / Self Care | Payer: BLUE CROSS/BLUE SHIELD | Source: Ambulatory Visit | Attending: Family Medicine | Admitting: Family Medicine

## 2018-07-02 DIAGNOSIS — Z1231 Encounter for screening mammogram for malignant neoplasm of breast: Secondary | ICD-10-CM | POA: Diagnosis not present

## 2018-07-21 DIAGNOSIS — M7752 Other enthesopathy of left foot: Secondary | ICD-10-CM | POA: Diagnosis not present

## 2018-07-21 DIAGNOSIS — M2012 Hallux valgus (acquired), left foot: Secondary | ICD-10-CM | POA: Diagnosis not present

## 2018-07-21 DIAGNOSIS — M25572 Pain in left ankle and joints of left foot: Secondary | ICD-10-CM | POA: Diagnosis not present

## 2018-07-24 DIAGNOSIS — M21612 Bunion of left foot: Secondary | ICD-10-CM | POA: Diagnosis not present

## 2018-07-24 DIAGNOSIS — M2012 Hallux valgus (acquired), left foot: Secondary | ICD-10-CM | POA: Diagnosis not present

## 2018-09-12 DIAGNOSIS — N760 Acute vaginitis: Secondary | ICD-10-CM | POA: Diagnosis not present

## 2018-09-25 DIAGNOSIS — R9389 Abnormal findings on diagnostic imaging of other specified body structures: Secondary | ICD-10-CM | POA: Diagnosis not present

## 2018-09-25 DIAGNOSIS — N950 Postmenopausal bleeding: Secondary | ICD-10-CM | POA: Diagnosis not present

## 2018-10-07 ENCOUNTER — Ambulatory Visit: Payer: BC Managed Care – PPO | Admitting: Family Medicine

## 2018-10-07 ENCOUNTER — Other Ambulatory Visit: Payer: Self-pay

## 2018-10-07 ENCOUNTER — Encounter: Payer: Self-pay | Admitting: Family Medicine

## 2018-10-07 VITALS — BP 100/60 | HR 77 | Temp 98.3°F | Resp 18 | Ht 68.0 in | Wt 158.4 lb

## 2018-10-07 DIAGNOSIS — T192XXA Foreign body in vulva and vagina, initial encounter: Secondary | ICD-10-CM | POA: Diagnosis not present

## 2018-10-08 DIAGNOSIS — T192XXA Foreign body in vulva and vagina, initial encounter: Secondary | ICD-10-CM | POA: Insufficient documentation

## 2018-10-08 NOTE — Assessment & Plan Note (Signed)
No tampon visualized No d/c or bleeding  F/u gyn prn

## 2018-10-08 NOTE — Progress Notes (Signed)
Patient ID: Holly Bailey Monje, female    DOB: 09-09-1965  Age: 53 y.o. MRN: [id number]    Subjective:  Subjective  HPI Holly Bailey Lycan presents for possible fb in vagina---  Pt period started after not having one for years but she stopped her bcp.  Her gyn has seen her and bx has been done and was normal.   Bleeding stopped but pt is concerned she left a tampon in .  No abd / pelvic pain   No d/c   Review of Systems  Constitutional: Negative for activity change, appetite change, fatigue and unexpected weight change.  Respiratory: Negative for cough and shortness of breath.   Cardiovascular: Negative for chest pain and palpitations.  Psychiatric/Behavioral: Negative for behavioral problems and dysphoric mood. The patient is not nervous/anxious.     History Past Medical History:  Diagnosis Date  . Allergy     She has a past surgical history that includes Finger fracture surgery (Left, 2018).   Her family history includes Colon cancer in her paternal grandmother; Hypertension in her paternal grandfather; Macular degeneration in her father; Multiple sclerosis in her mother.She reports that she has never smoked. She has never used smokeless tobacco. She reports current alcohol use of about 10.0 standard drinks of alcohol per week. She reports that she does not use drugs.  Current Outpatient Medications on File Prior to Visit  Medication Sig Dispense Refill  . cetirizine (ZYRTEC) 5 MG tablet Take 5 mg by mouth daily.    . DiphenhydrAMINE HCl (BENADRYL ALLERGY PO) Take by mouth.    Marland Kitchen. ibuprofen (ADVIL,MOTRIN) 400 MG tablet Take by mouth.    . Multiple Vitamin (MULTIVITAMIN) tablet Take 1 tablet by mouth daily.    Marland Kitchen. acetaminophen (TYLENOL) 500 MG tablet Take by mouth.    . bacitracin 500 UNIT/GM ointment Apply 1 application topically 2 (two) times daily. (Patient not taking: Reported on 03/14/2018) 15 g 0  . norethindrone-ethinyl estradiol (JUNEL FE 1/20) 1-20 MG-MCG per tablet Take 1 tablet by  mouth daily.      . ramelteon (ROZEREM) 8 MG tablet Take 1 tablet (8 mg total) by mouth at bedtime. (Patient not taking: Reported on 10/07/2018) 30 tablet 2   No current facility-administered medications on file prior to visit.      Objective:  Objective  Physical Exam Vitals signs and nursing note reviewed. Exam conducted with a chaperone present.  Constitutional:      Appearance: She is well-developed.  HENT:     Head: Normocephalic and atraumatic.  Eyes:     Conjunctiva/sclera: Conjunctivae normal.  Neck:     Musculoskeletal: Normal range of motion and neck supple.     Thyroid: No thyromegaly.     Vascular: No carotid bruit or JVD.  Cardiovascular:     Rate and Rhythm: Normal rate and regular rhythm.     Heart sounds: Normal heart sounds. No murmur.  Pulmonary:     Effort: Pulmonary effort is normal. No respiratory distress.     Breath sounds: Normal breath sounds. No wheezing or rales.  Chest:     Chest wall: No tenderness.  Genitourinary:    Exam position: Lithotomy position.     Labia:        Right: No rash or tenderness.        Left: No rash or tenderness.      Vagina: No signs of injury and foreign body. No vaginal discharge, erythema, tenderness, bleeding, lesions or prolapsed vaginal walls.  Cervix: Normal.     Neurological:     Mental Status: She is alert and oriented to person, place, and time.    BP 100/60 (BP Location: Right Arm, Patient Position: Sitting, Cuff Size: Normal)   Pulse 77   Temp 98.3 F (36.8 C) (Temporal)   Resp 18   Ht 5\' 8"  (1.727 m)   Wt 158 lb 6.4 oz (71.8 kg)   LMP  (LMP Unknown)   SpO2 99%   BMI 24.08 kg/m  Wt Readings from Last 3 Encounters:  10/07/18 158 lb 6.4 oz (71.8 kg)  04/08/18 156 lb 2 oz (70.8 kg)  03/28/18 163 lb (73.9 kg)     Lab Results  Component Value Date   WBC 7.6 04/08/2018   HGB 14.1 04/08/2018   HCT 41.4 04/08/2018   PLT 308.0 04/08/2018   GLUCOSE 93 04/08/2018   CHOL 195 04/08/2018   TRIG  65.0 04/08/2018   HDL 74.70 04/08/2018   LDLCALC 107 (H) 04/08/2018   ALT 23 04/08/2018   AST 25 04/08/2018   NA 139 04/08/2018   K 4.0 04/08/2018   CL 101 04/08/2018   CREATININE 0.88 04/08/2018   BUN 11 04/08/2018   CO2 29 04/08/2018   TSH 2.49 04/08/2018    Mm 3d Screen Breast Bilateral  Result Date: 07/02/2018 CLINICAL DATA:  Screening. EXAM: DIGITAL SCREENING BILATERAL MAMMOGRAM WITH TOMO AND CAD COMPARISON:  Previous exam(s). ACR Breast Density Category b: There are scattered areas of fibroglandular density. FINDINGS: There are no findings suspicious for malignancy. Images were processed with CAD. IMPRESSION: No mammographic evidence of malignancy. A result letter of this screening mammogram will be mailed directly to the patient. RECOMMENDATION: Screening mammogram in one year. (Code:SM-B-01Y) BI-RADS CATEGORY  1: Negative. Electronically Signed   By: Abelardo Diesel M.D.   On: 07/02/2018 08:14     Assessment & Plan:  Plan  I am having Mikaili Bailey. Prevo maintain her norethindrone-ethinyl estradiol, multivitamin, cetirizine, DiphenhydrAMINE HCl (BENADRYL ALLERGY PO), bacitracin, acetaminophen, ibuprofen, and ramelteon.  No orders of the defined types were placed in this encounter.   Problem List Items Addressed This Visit      Unprioritized   Foreign body in vagina - Primary    No tampon visualized No d/c or bleeding  F/u gyn prn          Follow-up: Return if symptoms worsen or fail to improve.  Ann Held, DO

## 2018-12-23 DIAGNOSIS — Z20828 Contact with and (suspected) exposure to other viral communicable diseases: Secondary | ICD-10-CM | POA: Diagnosis not present

## 2019-06-08 ENCOUNTER — Other Ambulatory Visit: Payer: Self-pay | Admitting: Family Medicine

## 2019-06-08 DIAGNOSIS — Z1231 Encounter for screening mammogram for malignant neoplasm of breast: Secondary | ICD-10-CM

## 2019-07-24 ENCOUNTER — Other Ambulatory Visit: Payer: Self-pay

## 2019-07-24 ENCOUNTER — Ambulatory Visit
Admission: RE | Admit: 2019-07-24 | Discharge: 2019-07-24 | Disposition: A | Discharge status: Home / Self Care | Payer: BC Managed Care – PPO | Source: Ambulatory Visit | Attending: Family Medicine | Admitting: Family Medicine

## 2019-07-24 ENCOUNTER — Ambulatory Visit: Payer: BC Managed Care – PPO

## 2019-07-24 DIAGNOSIS — Z1231 Encounter for screening mammogram for malignant neoplasm of breast: Secondary | ICD-10-CM

## 2019-10-12 DIAGNOSIS — J019 Acute sinusitis, unspecified: Secondary | ICD-10-CM | POA: Diagnosis not present

## 2019-11-26 DIAGNOSIS — L738 Other specified follicular disorders: Secondary | ICD-10-CM | POA: Diagnosis not present

## 2019-11-26 DIAGNOSIS — D1801 Hemangioma of skin and subcutaneous tissue: Secondary | ICD-10-CM | POA: Diagnosis not present

## 2019-11-26 DIAGNOSIS — D225 Melanocytic nevi of trunk: Secondary | ICD-10-CM | POA: Diagnosis not present

## 2019-11-26 DIAGNOSIS — L578 Other skin changes due to chronic exposure to nonionizing radiation: Secondary | ICD-10-CM | POA: Diagnosis not present

## 2020-05-05 DIAGNOSIS — M1612 Unilateral primary osteoarthritis, left hip: Secondary | ICD-10-CM | POA: Diagnosis not present

## 2020-06-15 ENCOUNTER — Other Ambulatory Visit: Payer: Self-pay | Admitting: Family Medicine

## 2020-06-15 DIAGNOSIS — Z1231 Encounter for screening mammogram for malignant neoplasm of breast: Secondary | ICD-10-CM

## 2020-07-16 DIAGNOSIS — J019 Acute sinusitis, unspecified: Secondary | ICD-10-CM | POA: Diagnosis not present

## 2020-07-29 ENCOUNTER — Ambulatory Visit
Admission: RE | Admit: 2020-07-29 | Discharge: 2020-07-29 | Disposition: A | Discharge status: Home / Self Care | Payer: BC Managed Care – PPO | Source: Ambulatory Visit | Attending: Family Medicine | Admitting: Family Medicine

## 2020-07-29 ENCOUNTER — Other Ambulatory Visit: Payer: Self-pay

## 2020-07-29 DIAGNOSIS — M1612 Unilateral primary osteoarthritis, left hip: Secondary | ICD-10-CM | POA: Diagnosis not present

## 2020-07-29 DIAGNOSIS — Z1231 Encounter for screening mammogram for malignant neoplasm of breast: Secondary | ICD-10-CM | POA: Diagnosis not present

## 2020-08-09 ENCOUNTER — Encounter: Payer: Self-pay | Admitting: Family Medicine

## 2020-08-09 ENCOUNTER — Ambulatory Visit (INDEPENDENT_AMBULATORY_CARE_PROVIDER_SITE_OTHER): Payer: BC Managed Care – PPO | Admitting: Family Medicine

## 2020-08-09 ENCOUNTER — Other Ambulatory Visit: Payer: Self-pay

## 2020-08-09 VITALS — BP 102/60 | HR 66 | Temp 98.7°F | Resp 18 | Ht 68.0 in | Wt 152.0 lb

## 2020-08-09 DIAGNOSIS — Z01818 Encounter for other preprocedural examination: Secondary | ICD-10-CM | POA: Diagnosis not present

## 2020-08-09 LAB — COMPREHENSIVE METABOLIC PANEL
ALT: 10 U/L (ref 0–35)
AST: 16 U/L (ref 0–37)
Albumin: 4.5 g/dL (ref 3.5–5.2)
Alkaline Phosphatase: 54 U/L (ref 39–117)
BUN: 14 mg/dL (ref 6–23)
CO2: 30 mEq/L (ref 19–32)
Calcium: 9.5 mg/dL (ref 8.4–10.5)
Chloride: 103 mEq/L (ref 96–112)
Creatinine, Ser: 0.85 mg/dL (ref 0.40–1.20)
GFR: 78.55 mL/min (ref >60.00)
Glucose, Bld: 87 mg/dL (ref 70–99)
Potassium: 4.5 mEq/L (ref 3.5–5.1)
Sodium: 140 mEq/L (ref 135–145)
Total Bilirubin: 0.7 mg/dL (ref 0.2–1.2)
Total Protein: 6.8 g/dL (ref 6.0–8.3)

## 2020-08-09 LAB — POC URINALSYSI DIPSTICK (AUTOMATED)
Bilirubin, UA: NEGATIVE
Blood, UA: NEGATIVE
Glucose, UA: NEGATIVE
Ketones, UA: NEGATIVE
Leukocytes, UA: NEGATIVE
Nitrite, UA: NEGATIVE
Protein, UA: NEGATIVE
Spec Grav, UA: 1.015 (ref 1.010–1.025)
Urobilinogen, UA: 0.2 E.U./dL
pH, UA: 6 (ref 5.0–8.0)

## 2020-08-09 LAB — CBC WITH DIFFERENTIAL/PLATELET
Basophils Absolute: 0 10*3/uL (ref 0.0–0.1)
Basophils Relative: 0.4 % (ref 0.0–3.0)
Eosinophils Absolute: 0.1 10*3/uL (ref 0.0–0.7)
Eosinophils Relative: 1.8 % (ref 0.0–5.0)
HCT: 39.3 % (ref 36.0–46.0)
Hemoglobin: 13.1 g/dL (ref 12.0–15.0)
Lymphocytes Relative: 32.4 % (ref 12.0–46.0)
Lymphs Abs: 2.2 10*3/uL (ref 0.7–4.0)
MCHC: 33.2 g/dL (ref 30.0–36.0)
MCV: 88 fl (ref 78.0–100.0)
Monocytes Absolute: 0.5 10*3/uL (ref 0.1–1.0)
Monocytes Relative: 8.2 % (ref 3.0–12.0)
Neutro Abs: 3.8 10*3/uL (ref 1.4–7.7)
Neutrophils Relative %: 57.2 % (ref 43.0–77.0)
Platelets: 291 10*3/uL (ref 150.0–400.0)
RBC: 4.47 Mil/uL (ref 3.87–5.11)
RDW: 13.7 % (ref 11.5–15.5)
WBC: 6.7 10*3/uL (ref 4.0–10.5)

## 2020-08-09 LAB — PROTIME-INR
INR: 1 ratio (ref 0.8–1.0)
Prothrombin Time: 10.9 s (ref 9.6–13.1)

## 2020-08-09 LAB — ALBUMIN: Albumin: 4.5 g/dL (ref 3.5–5.2)

## 2020-08-09 LAB — HEMOGLOBIN A1C: Hgb A1c MFr Bld: 5.5 % (ref 4.6–6.5)

## 2020-08-09 NOTE — Progress Notes (Signed)
Subjective:   By signing my name below, I, Shehryar Baig, attest that this documentation has been prepared under the direction and in the presence of Dr. Seabron Spates, DO. 08/09/2020     Patient ID: Holly Bailey, female    DOB: 1965-11-26, 55 y.o.   MRN: [id number]  Chief Complaint  Patient presents with   Pre-op Exam    Left hip replacement     HPI Patient is in today for surgical clearance. She reports seeing her orthopedic provider to help manage her left hip pain due to arthritis and found she needed total hip replacement procedure. She understood and consented to the procedure and is having the procedure on September 13, 2020.  She is requesting a bone density scan. Her mother has a history of osteopenia. She has a history of low estrogen levels and osteoarthritis in her hips. She reports that her menopause stopped approximately 2 years ago. She was active and played in sports around age [age]. She is interested in getting a scan at her GYN providers office.   Past Medical History:  Diagnosis Date   Allergy     Past Surgical History:  Procedure Laterality Date   FINGER FRACTURE SURGERY Left 2018    Family History  Problem Relation Age of Onset   Multiple sclerosis Mother        in remission   Macular degeneration Father    Colon cancer Paternal Grandmother    Hypertension Paternal Grandfather    Breast cancer Cousin 56   Colon polyps Neg Hx    Esophageal cancer Neg Hx    Rectal cancer Neg Hx    Stomach cancer Neg Hx     Social History   Socioeconomic History   Marital status: Married    Spouse name: Not on file   Number of children: Not on file   Years of education: Not on file   Highest education level: Not on file  Occupational History    Comment: [employer] for [employer]   Tobacco Use   Smoking status: Never   Smokeless tobacco: Never  Substance and Sexual Activity   Alcohol use: Yes    Alcohol/week: 10.0 standard drinks    Types: 10 Glasses of  wine per week   Drug use: No   Sexual activity: Yes    Partners: Male  Other Topics Concern   Not on file  Social History Narrative   Exercise--  Walking dog 1-2 miles a day   Social Determinants of Health   Financial Resource Strain: Not on file  Food Insecurity: Not on file  Transportation Needs: Not on file  Physical Activity: Not on file  Stress: Not on file  Social Connections: Not on file  Intimate Partner Violence: Not on file    Outpatient Medications Prior to Visit  Medication Sig Dispense Refill   acetaminophen (TYLENOL) 500 MG tablet Take by mouth.     cetirizine (ZYRTEC) 5 MG tablet Take 5 mg by mouth daily.     DiphenhydrAMINE HCl (BENADRYL ALLERGY PO) Take by mouth.     ibuprofen (ADVIL,MOTRIN) 400 MG tablet Take by mouth.     Multiple Vitamin (MULTIVITAMIN) tablet Take 1 tablet by mouth daily.     bacitracin 500 UNIT/GM ointment Apply 1 application topically 2 (two) times daily. (Patient not taking: No sig reported) 15 g 0   norethindrone-ethinyl estradiol (JUNEL FE 1/20) 1-20 MG-MCG per tablet Take 1 tablet by mouth daily.  ramelteon (ROZEREM) 8 MG tablet Take 1 tablet (8 mg total) by mouth at bedtime. (Patient not taking: Reported on 10/07/2018) 30 tablet 2   No facility-administered medications prior to visit.    No Known Allergies  Review of Systems  Constitutional:  Negative for chills, fever and malaise/fatigue.  HENT:  Negative for congestion and hearing loss.   Eyes:  Negative for blurred vision and discharge.  Respiratory:  Negative for cough, sputum production and shortness of breath.   Cardiovascular:  Negative for chest pain, palpitations and leg swelling.  Gastrointestinal:  Negative for abdominal pain, blood in stool, constipation, diarrhea, heartburn, nausea and vomiting.  Genitourinary:  Negative for dysuria, frequency, hematuria and urgency.  Musculoskeletal:  Positive for joint pain (Left hip due to arthritis). Negative for back pain,  falls and myalgias.  Skin:  Negative for rash.  Neurological:  Negative for dizziness, sensory change, loss of consciousness, weakness and headaches.  Endo/Heme/Allergies:  Negative for environmental allergies. Does not bruise/bleed easily.  Psychiatric/Behavioral:  Negative for depression and suicidal ideas. The patient is not nervous/anxious and does not have insomnia.       Objective:    Physical Exam Vitals and nursing note reviewed.  Constitutional:      General: She is not in acute distress.    Appearance: Normal appearance. She is not ill-appearing.  HENT:     Head: Normocephalic and atraumatic.     Right Ear: Tympanic membrane, ear canal and external ear normal.     Left Ear: Tympanic membrane, ear canal and external ear normal.  Eyes:     Extraocular Movements: Extraocular movements intact.     Pupils: Pupils are equal, round, and reactive to light.  Cardiovascular:     Rate and Rhythm: Normal rate and regular rhythm.     Pulses: Normal pulses.     Heart sounds: Normal heart sounds. No murmur heard. Pulmonary:     Effort: Pulmonary effort is normal. No respiratory distress.     Breath sounds: Normal breath sounds. No wheezing, rhonchi or rales.  Musculoskeletal:        General: Tenderness present.     Comments: L hip pain  Skin:    General: Skin is warm and dry.  Neurological:     Mental Status: She is alert and oriented to person, place, and time.  Psychiatric:        Behavior: Behavior normal.        Judgment: Judgment normal.    BP 102/60 (BP Location: Right Arm, Patient Position: Sitting, Cuff Size: Normal)   Pulse 66   Temp 98.7 F (37.1 C) (Oral)   Resp 18   Ht 5\' 8"  (1.727 m)   Wt 152 lb (68.9 kg)   LMP  (LMP Unknown)   SpO2 99%   BMI 23.11 kg/m  Wt Readings from Last 3 Encounters:  08/09/20 152 lb (68.9 kg)  10/07/18 158 lb 6.4 oz (71.8 kg)  04/08/18 156 lb 2 oz (70.8 kg)    Diabetic Foot Exam - Simple   No data filed    Lab Results   Component Value Date   WBC 7.6 04/08/2018   HGB 14.1 04/08/2018   HCT 41.4 04/08/2018   PLT 308.0 04/08/2018   GLUCOSE 93 04/08/2018   CHOL 195 04/08/2018   TRIG 65.0 04/08/2018   HDL 74.70 04/08/2018   LDLCALC 107 (H) 04/08/2018   ALT 23 04/08/2018   AST 25 04/08/2018   NA 139 04/08/2018   K  4.0 04/08/2018   CL 101 04/08/2018   CREATININE 0.88 04/08/2018   BUN 11 04/08/2018   CO2 29 04/08/2018   TSH 2.49 04/08/2018    Lab Results  Component Value Date   TSH 2.49 04/08/2018   Lab Results  Component Value Date   WBC 7.6 04/08/2018   HGB 14.1 04/08/2018   HCT 41.4 04/08/2018   MCV 89.2 04/08/2018   PLT 308.0 04/08/2018   Lab Results  Component Value Date   NA 139 04/08/2018   K 4.0 04/08/2018   CO2 29 04/08/2018   GLUCOSE 93 04/08/2018   BUN 11 04/08/2018   CREATININE 0.88 04/08/2018   BILITOT 0.8 04/08/2018   ALKPHOS 53 04/08/2018   AST 25 04/08/2018   ALT 23 04/08/2018   PROT 7.2 04/08/2018   ALBUMIN 4.6 04/08/2018   CALCIUM 9.9 04/08/2018   GFR 67.89 04/08/2018   Lab Results  Component Value Date   CHOL 195 04/08/2018   Lab Results  Component Value Date   HDL 74.70 04/08/2018   Lab Results  Component Value Date   LDLCALC 107 (H) 04/08/2018   Lab Results  Component Value Date   TRIG 65.0 04/08/2018   Lab Results  Component Value Date   CHOLHDL 3 04/08/2018   No results found for: HGBA1C Ekg-- nsr --- no change from previous    Assessment & Plan:   Problem List Items Addressed This Visit   None Visit Diagnoses     Pre-op evaluation    -  Primary   Relevant Orders   EKG 12-Lead (Completed)   CBC with Differential/Platelet   Comprehensive metabolic panel   Hemoglobin A1c   Protime-INR ( [facility] ONLY)   POCT Urinalysis Dipstick (Automated) (Completed)   Albumin      Pt cleared for surgery----  form filled out--- labs pending   No orders of the defined types were placed in this encounter.   I, Dr. Seabron Spates, DO,  personally preformed the services described in this documentation.  All medical record entries made by the scribe were at my direction and in my presence.  I have reviewed the chart and discharge instructions (if applicable) and agree that the record reflects my personal performance and is accurate and complete. 08/09/2020   I,Shehryar Baig,acting as a Neurosurgeon for [employer], DO.,have documented all relevant documentation on the behalf of Donato Seats, DO,as directed by  Donato Mccullum, DO while in the presence of Donato Friesz, DO.   Donato Kann, DO

## 2020-08-09 NOTE — Patient Instructions (Signed)
Surgical clearance °

## 2020-08-11 ENCOUNTER — Ambulatory Visit: Payer: BC Managed Care – PPO | Admitting: Family Medicine

## 2020-08-30 DIAGNOSIS — M1612 Unilateral primary osteoarthritis, left hip: Secondary | ICD-10-CM | POA: Diagnosis not present

## 2020-12-13 IMAGING — MG DIGITAL SCREENING BILATERAL MAMMOGRAM WITH TOMO AND CAD
8 series · 8 of 24 positions shown · non-contrast
Comparison: Previous exam(s).

CLINICAL DATA: Screening.

EXAM:
DIGITAL SCREENING BILATERAL MAMMOGRAM WITH TOMO AND CAD

[R MLO synth-2D]
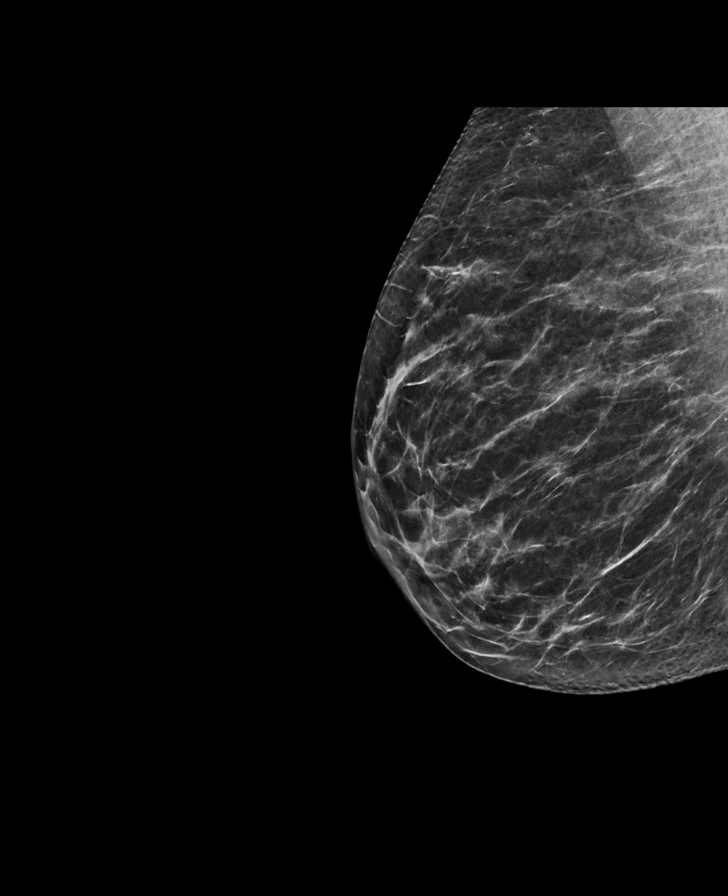

[L MLO synth-2D]
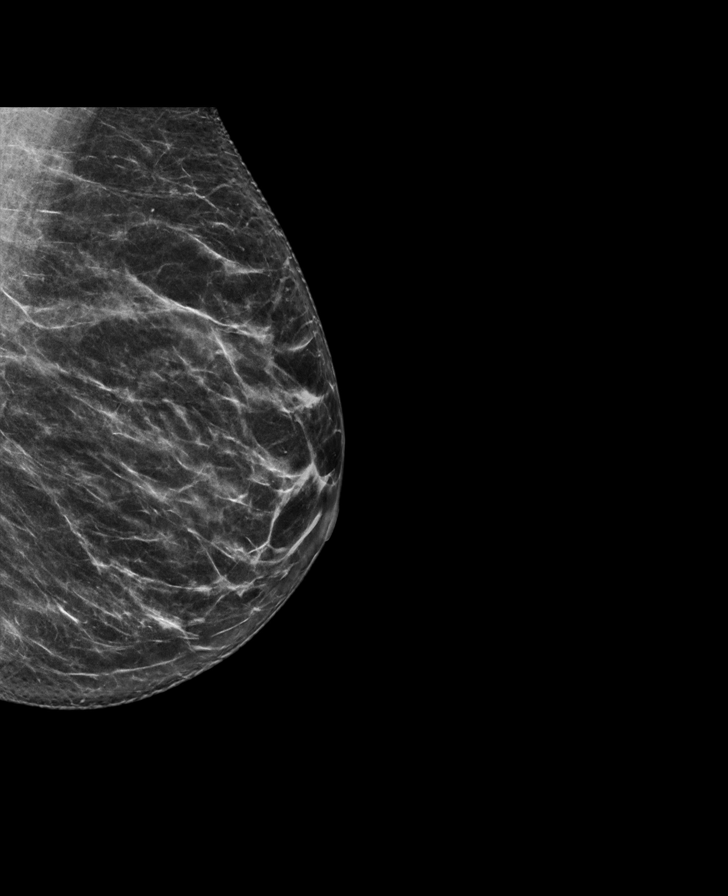

[R CC synth-2D]
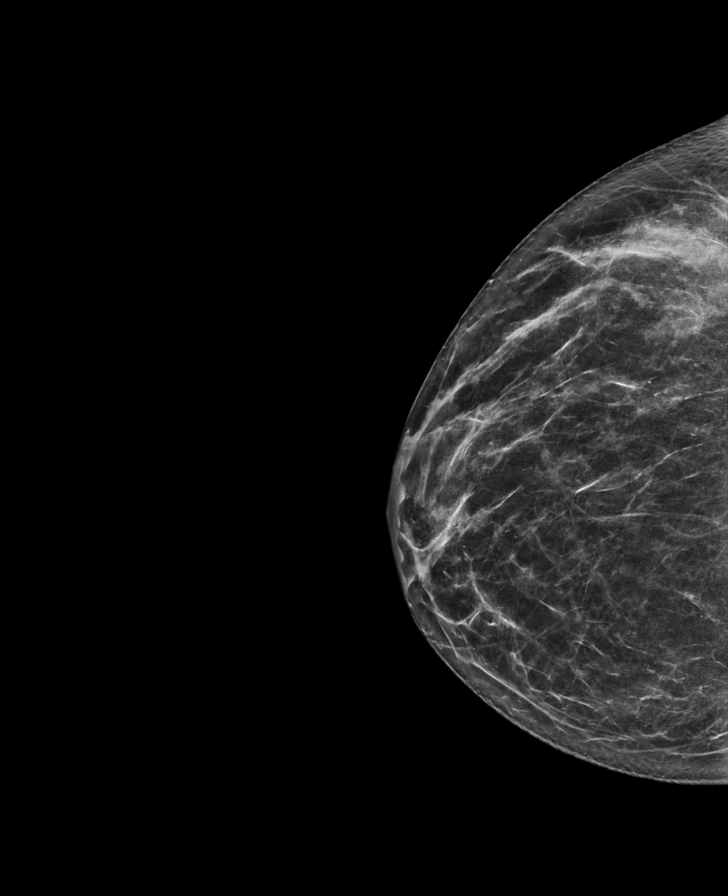

[L CC synth-2D]
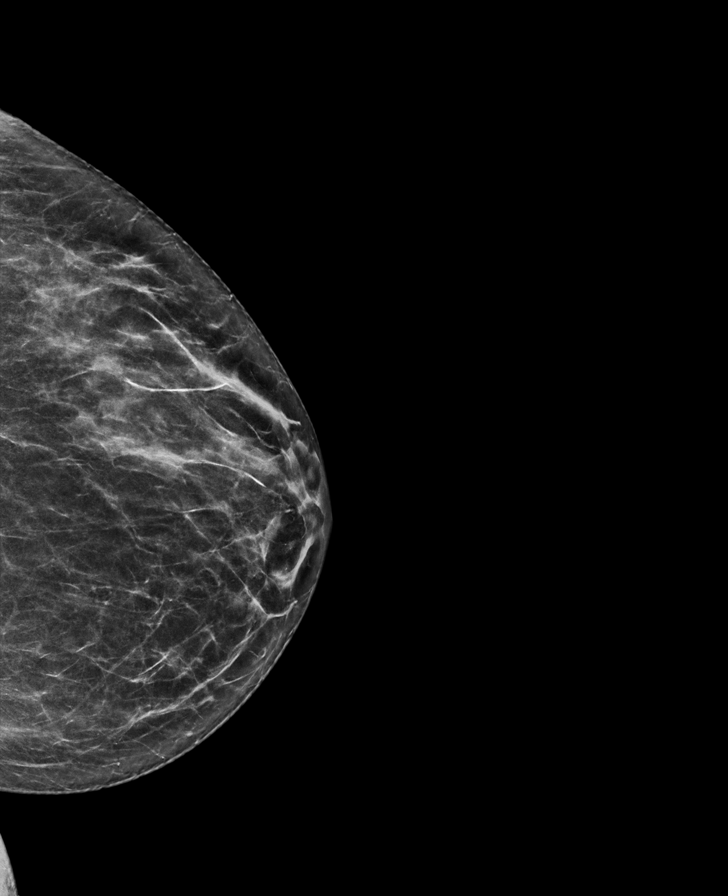

[L CC tomo · tomo slice 33/66.0]
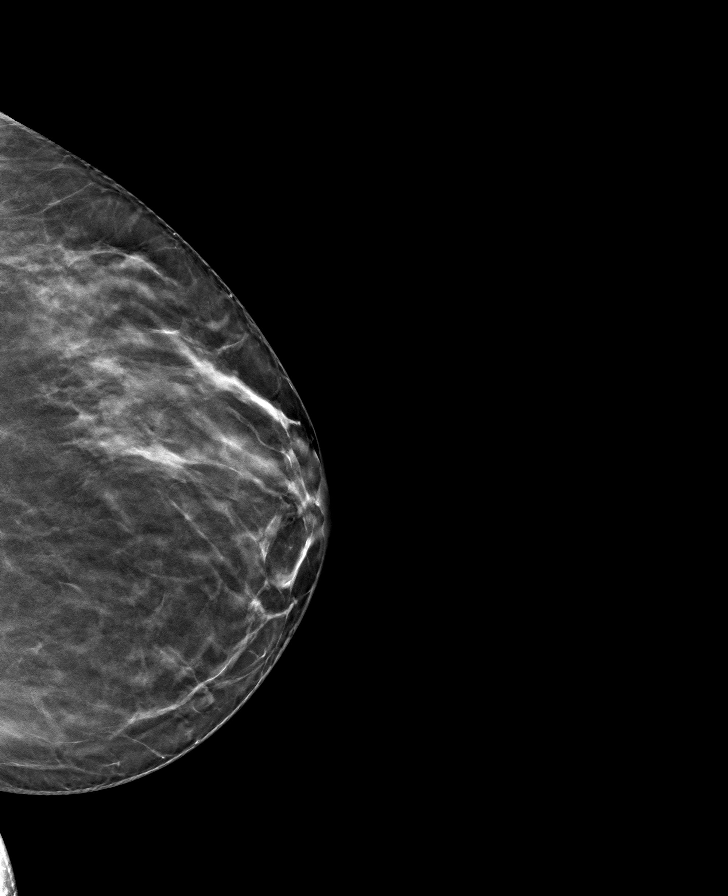

[R CC tomo · tomo slice 33/66.0]
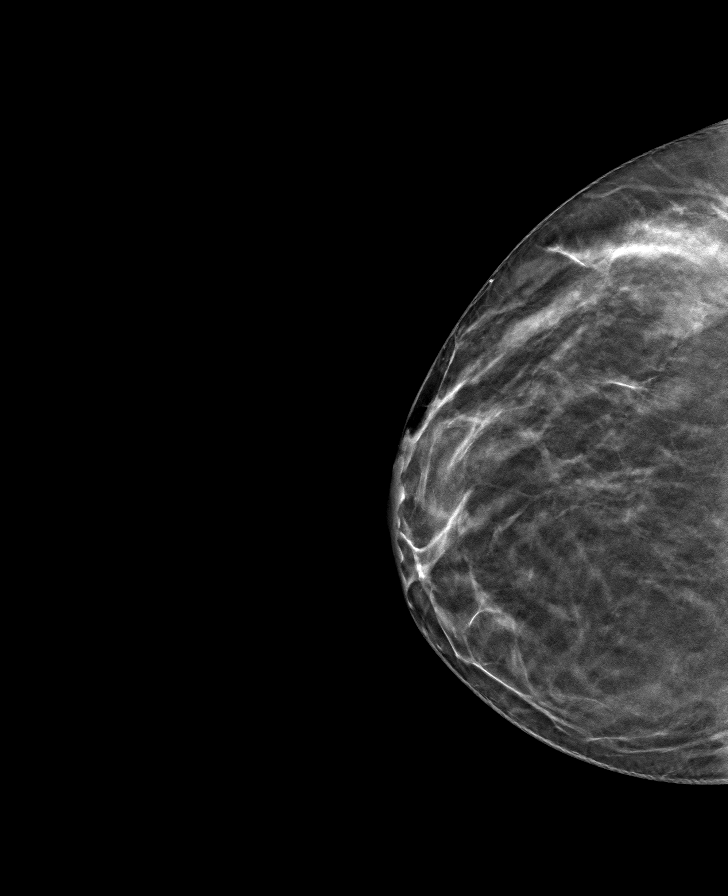

[R MLO tomo · tomo slice 35/68.0]
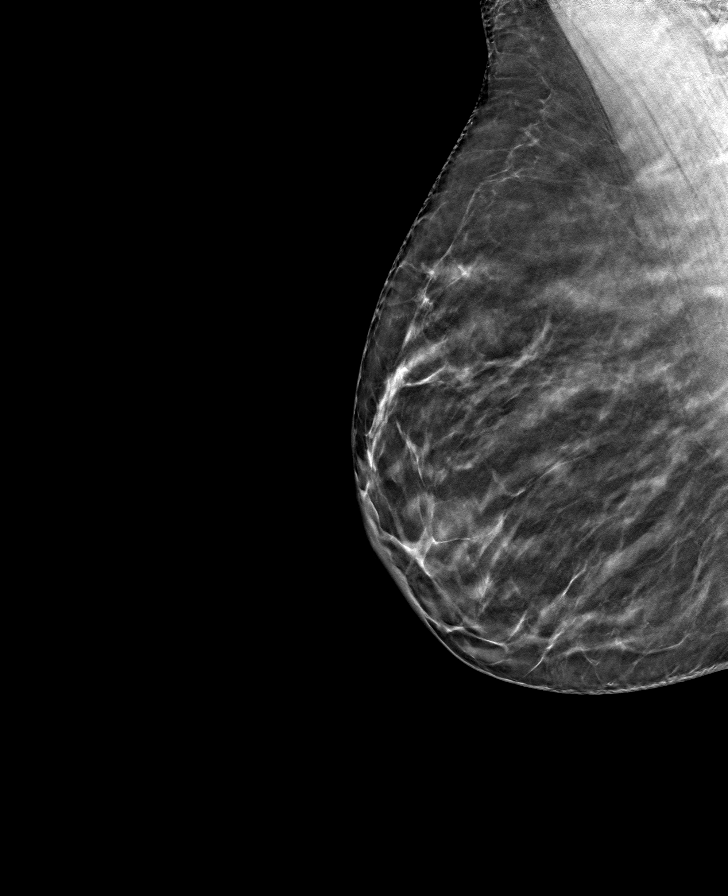

[L MLO tomo · tomo slice 35/70.0]
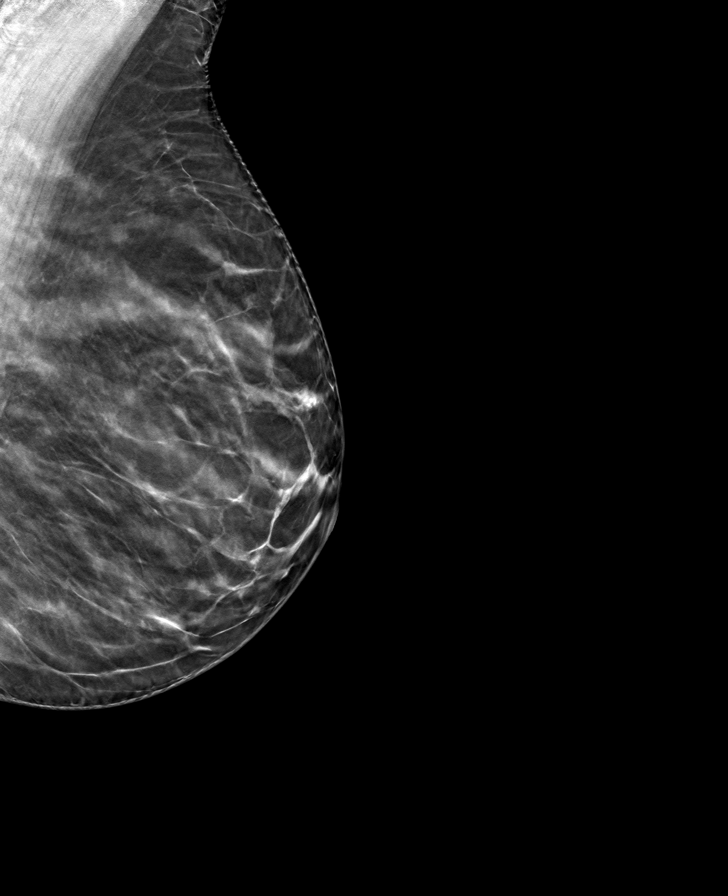

[8 of 24 positions shown; findings below may reference images not displayed]

ACR Breast Density Category b: There are scattered areas of
fibroglandular density.
FINDINGS: There are no findings suspicious for malignancy. Images were
processed with CAD.
IMPRESSION: No mammographic evidence of malignancy. A result letter of this
screening mammogram will be mailed directly to the patient.

RECOMMENDATION:
Screening mammogram in one year. (Code:[id number])

BI-RADS CATEGORY  1: Negative.

## 2021-04-14 ENCOUNTER — Other Ambulatory Visit: Payer: Self-pay | Admitting: Family Medicine

## 2021-04-14 DIAGNOSIS — Z1231 Encounter for screening mammogram for malignant neoplasm of breast: Secondary | ICD-10-CM

## 2021-05-08 DIAGNOSIS — Z6823 Body mass index (BMI) 23.0-23.9, adult: Secondary | ICD-10-CM | POA: Diagnosis not present

## 2021-05-08 DIAGNOSIS — Z124 Encounter for screening for malignant neoplasm of cervix: Secondary | ICD-10-CM | POA: Diagnosis not present

## 2021-05-08 DIAGNOSIS — Z01411 Encounter for gynecological examination (general) (routine) with abnormal findings: Secondary | ICD-10-CM | POA: Diagnosis not present

## 2021-05-08 DIAGNOSIS — Z0142 Encounter for cervical smear to confirm findings of recent normal smear following initial abnormal smear: Secondary | ICD-10-CM | POA: Diagnosis not present

## 2021-05-08 DIAGNOSIS — Z113 Encounter for screening for infections with a predominantly sexual mode of transmission: Secondary | ICD-10-CM | POA: Diagnosis not present

## 2021-05-08 DIAGNOSIS — Z01419 Encounter for gynecological examination (general) (routine) without abnormal findings: Secondary | ICD-10-CM | POA: Diagnosis not present

## 2021-07-10 DIAGNOSIS — R300 Dysuria: Secondary | ICD-10-CM | POA: Diagnosis not present

## 2021-08-07 ENCOUNTER — Ambulatory Visit
Admission: RE | Admit: 2021-08-07 | Discharge: 2021-08-07 | Disposition: A | Discharge status: Home / Self Care | Payer: BC Managed Care – PPO | Source: Ambulatory Visit | Attending: Family Medicine | Admitting: Family Medicine

## 2021-08-07 ENCOUNTER — Ambulatory Visit: Payer: BC Managed Care – PPO

## 2021-08-07 DIAGNOSIS — Z1231 Encounter for screening mammogram for malignant neoplasm of breast: Secondary | ICD-10-CM | POA: Diagnosis not present

## 2021-10-18 DIAGNOSIS — Z96642 Presence of left artificial hip joint: Secondary | ICD-10-CM | POA: Diagnosis not present

## 2021-10-18 DIAGNOSIS — M25551 Pain in right hip: Secondary | ICD-10-CM | POA: Diagnosis not present

## 2021-11-07 DIAGNOSIS — M7531 Calcific tendinitis of right shoulder: Secondary | ICD-10-CM | POA: Diagnosis not present

## 2021-11-07 DIAGNOSIS — M25511 Pain in right shoulder: Secondary | ICD-10-CM | POA: Diagnosis not present

## 2022-01-27 DIAGNOSIS — B379 Candidiasis, unspecified: Secondary | ICD-10-CM | POA: Diagnosis not present

## 2022-01-27 DIAGNOSIS — J0140 Acute pansinusitis, unspecified: Secondary | ICD-10-CM | POA: Diagnosis not present

## 2022-01-27 DIAGNOSIS — T3695XA Adverse effect of unspecified systemic antibiotic, initial encounter: Secondary | ICD-10-CM | POA: Diagnosis not present

## 2022-01-27 DIAGNOSIS — Z6822 Body mass index (BMI) 22.0-22.9, adult: Secondary | ICD-10-CM | POA: Diagnosis not present

## 2022-05-15 DIAGNOSIS — L0292 Furuncle, unspecified: Secondary | ICD-10-CM | POA: Diagnosis not present

## 2022-06-20 DIAGNOSIS — Z01411 Encounter for gynecological examination (general) (routine) with abnormal findings: Secondary | ICD-10-CM | POA: Diagnosis not present

## 2022-06-20 DIAGNOSIS — Z8619 Personal history of other infectious and parasitic diseases: Secondary | ICD-10-CM | POA: Diagnosis not present

## 2022-07-12 ENCOUNTER — Other Ambulatory Visit: Payer: Self-pay | Admitting: Family Medicine

## 2022-07-12 DIAGNOSIS — Z1231 Encounter for screening mammogram for malignant neoplasm of breast: Secondary | ICD-10-CM

## 2022-08-09 ENCOUNTER — Ambulatory Visit
Admission: RE | Admit: 2022-08-09 | Discharge: 2022-08-09 | Disposition: A | Discharge status: Home / Self Care | Payer: BC Managed Care – PPO | Source: Ambulatory Visit | Attending: Family Medicine | Admitting: Family Medicine

## 2022-08-09 DIAGNOSIS — Z1231 Encounter for screening mammogram for malignant neoplasm of breast: Secondary | ICD-10-CM

## 2022-08-26 DIAGNOSIS — J019 Acute sinusitis, unspecified: Secondary | ICD-10-CM | POA: Diagnosis not present

## 2022-08-26 DIAGNOSIS — R300 Dysuria: Secondary | ICD-10-CM | POA: Diagnosis not present

## 2022-08-26 DIAGNOSIS — B09 Unspecified viral infection characterized by skin and mucous membrane lesions: Secondary | ICD-10-CM | POA: Diagnosis not present

## 2022-08-26 DIAGNOSIS — Z03818 Encounter for observation for suspected exposure to other biological agents ruled out: Secondary | ICD-10-CM | POA: Diagnosis not present

## 2022-09-25 DIAGNOSIS — H5319 Other subjective visual disturbances: Secondary | ICD-10-CM | POA: Diagnosis not present

## 2022-09-25 DIAGNOSIS — H43391 Other vitreous opacities, right eye: Secondary | ICD-10-CM | POA: Diagnosis not present

## 2022-09-25 DIAGNOSIS — H43811 Vitreous degeneration, right eye: Secondary | ICD-10-CM | POA: Diagnosis not present

## 2022-09-25 DIAGNOSIS — H53141 Visual discomfort, right eye: Secondary | ICD-10-CM | POA: Diagnosis not present

## 2022-10-09 DIAGNOSIS — H53141 Visual discomfort, right eye: Secondary | ICD-10-CM | POA: Diagnosis not present

## 2022-10-09 DIAGNOSIS — H43391 Other vitreous opacities, right eye: Secondary | ICD-10-CM | POA: Diagnosis not present

## 2022-10-09 DIAGNOSIS — H43811 Vitreous degeneration, right eye: Secondary | ICD-10-CM | POA: Diagnosis not present

## 2022-10-09 DIAGNOSIS — H5319 Other subjective visual disturbances: Secondary | ICD-10-CM | POA: Diagnosis not present

## 2023-06-28 ENCOUNTER — Other Ambulatory Visit: Payer: Self-pay | Admitting: Family Medicine

## 2023-06-28 DIAGNOSIS — Z1231 Encounter for screening mammogram for malignant neoplasm of breast: Secondary | ICD-10-CM

## 2023-08-27 ENCOUNTER — Ambulatory Visit
Admission: RE | Admit: 2023-08-27 | Discharge: 2023-08-27 | Disposition: A | Discharge status: Home / Self Care | Source: Ambulatory Visit | Attending: Family Medicine | Admitting: Family Medicine

## 2023-08-27 DIAGNOSIS — Z1231 Encounter for screening mammogram for malignant neoplasm of breast: Secondary | ICD-10-CM
